# Patient Record
Sex: Male | Born: 1985 | Race: Black or African American | Hispanic: No | Marital: Single | Smoking: Current every day smoker
Health system: Southern US, Community
[De-identification: ages and names within clinical notes are randomized; demographics above are authoritative.]

## PROBLEM LIST (undated history)

## (undated) DIAGNOSIS — I10 Essential (primary) hypertension: Secondary | ICD-10-CM

## (undated) HISTORY — PX: TONSILLECTOMY: SUR1361

## (undated) HISTORY — PX: ADENOIDECTOMY: SUR15

## (undated) HISTORY — PX: NECK SURGERY: SHX720

---

## 2003-09-16 ENCOUNTER — Emergency Department (HOSPITAL_COMMUNITY): Admission: EM | Admit: 2003-09-16 | Discharge: 2003-09-16 | Payer: Self-pay

## 2006-11-20 ENCOUNTER — Emergency Department (HOSPITAL_COMMUNITY): Admission: EM | Admit: 2006-11-20 | Discharge: 2006-11-20 | Payer: Self-pay | Admitting: Emergency Medicine

## 2008-07-19 ENCOUNTER — Emergency Department (HOSPITAL_BASED_OUTPATIENT_CLINIC_OR_DEPARTMENT_OTHER): Admission: EM | Admit: 2008-07-19 | Discharge: 2008-07-19 | Payer: Self-pay | Admitting: Emergency Medicine

## 2008-07-19 ENCOUNTER — Ambulatory Visit: Payer: Self-pay | Admitting: Diagnostic Radiology

## 2011-04-01 ENCOUNTER — Emergency Department (HOSPITAL_BASED_OUTPATIENT_CLINIC_OR_DEPARTMENT_OTHER)
Admission: EM | Admit: 2011-04-01 | Discharge: 2011-04-01 | Disposition: A | Discharge status: Home / Self Care | Payer: Self-pay | Attending: Emergency Medicine | Admitting: Emergency Medicine

## 2011-04-01 ENCOUNTER — Emergency Department (INDEPENDENT_AMBULATORY_CARE_PROVIDER_SITE_OTHER): Payer: Self-pay

## 2011-04-01 DIAGNOSIS — M79609 Pain in unspecified limb: Secondary | ICD-10-CM

## 2011-04-01 DIAGNOSIS — I10 Essential (primary) hypertension: Secondary | ICD-10-CM | POA: Insufficient documentation

## 2011-04-01 DIAGNOSIS — M7989 Other specified soft tissue disorders: Secondary | ICD-10-CM | POA: Insufficient documentation

## 2011-04-01 DIAGNOSIS — F17200 Nicotine dependence, unspecified, uncomplicated: Secondary | ICD-10-CM | POA: Insufficient documentation

## 2011-04-01 DIAGNOSIS — IMO0002 Reserved for concepts with insufficient information to code with codable children: Secondary | ICD-10-CM | POA: Insufficient documentation

## 2011-04-01 HISTORY — DX: Essential (primary) hypertension: I10

## 2011-04-01 MED ORDER — IBUPROFEN 800 MG PO TABS
800.0000 mg | ORAL_TABLET | Freq: Once | ORAL | Status: AC
  Administered 2011-04-01: 800 mg via ORAL

## 2011-04-01 MED ORDER — IBUPROFEN 800 MG PO TABS
ORAL_TABLET | ORAL | Status: AC
  Filled 2011-04-01: qty 1

## 2011-04-01 MED ORDER — CLINDAMYCIN HCL 150 MG PO CAPS
300.0000 mg | ORAL_CAPSULE | Freq: Once | ORAL | Status: DC
  Filled 2011-04-01: qty 2

## 2011-04-01 MED ORDER — CLINDAMYCIN HCL 150 MG PO CAPS: 300.0000 mg | ORAL_CAPSULE | Freq: Three times a day (TID) | ORAL | Status: AC

## 2011-04-01 NOTE — ED Notes (Signed)
Care plan and use of Medications reviewed follow up for BP Reviewed

## 2011-04-01 NOTE — ED Notes (Signed)
Pain/swelling to right middle finger x 3-4 days-denies injury
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
## 2011-04-01 NOTE — ED Notes (Signed)
Pt reports increased pain and swelling to  R hand and middle finger  With decreased mobility

## 2011-12-25 ENCOUNTER — Encounter (HOSPITAL_BASED_OUTPATIENT_CLINIC_OR_DEPARTMENT_OTHER): Payer: Self-pay | Admitting: Emergency Medicine

## 2011-12-25 ENCOUNTER — Emergency Department (HOSPITAL_BASED_OUTPATIENT_CLINIC_OR_DEPARTMENT_OTHER): Payer: Self-pay

## 2011-12-25 ENCOUNTER — Emergency Department (HOSPITAL_BASED_OUTPATIENT_CLINIC_OR_DEPARTMENT_OTHER)
Admission: EM | Admit: 2011-12-25 | Discharge: 2011-12-25 | Disposition: A | Discharge status: Home / Self Care | Payer: Self-pay | Attending: Emergency Medicine | Admitting: Emergency Medicine

## 2011-12-25 DIAGNOSIS — F1490 Cocaine use, unspecified, uncomplicated: Secondary | ICD-10-CM

## 2011-12-25 DIAGNOSIS — R079 Chest pain, unspecified: Secondary | ICD-10-CM | POA: Insufficient documentation

## 2011-12-25 DIAGNOSIS — F1410 Cocaine abuse, uncomplicated: Secondary | ICD-10-CM | POA: Insufficient documentation

## 2011-12-25 DIAGNOSIS — R51 Headache: Secondary | ICD-10-CM | POA: Insufficient documentation

## 2011-12-25 DIAGNOSIS — I10 Essential (primary) hypertension: Secondary | ICD-10-CM | POA: Insufficient documentation

## 2011-12-25 LAB — CBC
HCT: 43.7 % (ref 39.0–52.0)
Hemoglobin: 14.9 g/dL (ref 13.0–17.0)
MCH: 28.2 pg (ref 26.0–34.0)
MCV: 82.8 fL (ref 78.0–100.0)
Platelets: 236 10*3/uL (ref 150–400)
RBC: 5.28 MIL/uL (ref 4.22–5.81)
WBC: 4.9 10*3/uL (ref 4.0–10.5)

## 2011-12-25 LAB — BASIC METABOLIC PANEL
BUN: 13 mg/dL (ref 6–23)
CO2: 27 mEq/L (ref 19–32)
Calcium: 9.8 mg/dL (ref 8.4–10.5)
Chloride: 101 mEq/L (ref 96–112)
Creatinine, Ser: 1.4 mg/dL — ABNORMAL HIGH (ref 0.50–1.35)
Glucose, Bld: 96 mg/dL (ref 70–99)

## 2011-12-25 LAB — TROPONIN I
Troponin I: <0.3 ng/mL (ref <0.30)
Troponin I: <0.3 ng/mL (ref <0.30)

## 2011-12-25 LAB — RAPID URINE DRUG SCREEN, HOSP PERFORMED
Amphetamines: NOT DETECTED
Barbiturates: NOT DETECTED
Benzodiazepines: NOT DETECTED
Tetrahydrocannabinol: POSITIVE — AB

## 2011-12-25 MED ORDER — ASPIRIN 81 MG PO CHEW
324.0000 mg | CHEWABLE_TABLET | Freq: Once | ORAL | Status: AC
  Administered 2011-12-25: 324 mg via ORAL

## 2011-12-25 MED ORDER — DIPHENHYDRAMINE HCL 50 MG/ML IJ SOLN
12.5000 mg | Freq: Once | INTRAMUSCULAR | Status: AC
  Administered 2011-12-25: 12.5 mg via INTRAVENOUS
  Filled 2011-12-25: qty 1

## 2011-12-25 MED ORDER — ASPIRIN 325 MG PO TABS: 325.0000 mg | ORAL_TABLET | ORAL | Status: DC

## 2011-12-25 MED ORDER — METOCLOPRAMIDE HCL 5 MG/ML IJ SOLN
10.0000 mg | Freq: Once | INTRAMUSCULAR | Status: AC
  Administered 2011-12-25: 10 mg via INTRAVENOUS
  Filled 2011-12-25: qty 2

## 2011-12-25 MED ORDER — ACETAMINOPHEN 500 MG PO TABS
ORAL_TABLET | ORAL | Status: AC
  Filled 2011-12-25: qty 2

## 2011-12-25 MED ORDER — NITROGLYCERIN 0.4 MG SL SUBL
0.4000 mg | SUBLINGUAL_TABLET | SUBLINGUAL | Status: DC | PRN
  Filled 2011-12-25: qty 25

## 2011-12-25 MED ORDER — ASPIRIN 81 MG PO CHEW
CHEWABLE_TABLET | ORAL | Status: AC
  Filled 2011-12-25: qty 4

## 2011-12-25 NOTE — ED Notes (Signed)
Pt c/o headache and intermittent chest pain for two weeks.  Pt states headache this am.  Pt not taking medication for HTN, states he lost insurance..  Pt admits to some dizziness at times.

## 2011-12-25 NOTE — ED Provider Notes (Addendum)
History   This chart was scribed for Rodney Numbers, MD scribed by Magnus Sinning. The patient was seen in room MH06/[facility]06 at 17:33.   CSN: [id number]  Arrival date & time 12/25/11  1702   Chief Complaint  Patient presents with  . Chest Pain  . Headache    (Consider location/radiation/quality/duration/timing/severity/associated sxs/prior treatment) HPI Rodney Ramirez is a 26 y.o. male who presents to the Emergency Department complaining of moderate intermittent CP, onset 4 weeks ago with associated HA, onset this morning. Patient states he has had hx of HTN since he was 37 and explains it has been several years since stopping medication for treatment of HTN, which he states is due to loss of insurance and ability to see a physician.  Patient says there are no aggravating or modifying factors of chest pain and that pain can appear at any moment. He currently rates both HA and CP a 6/10 and says this is the worst of his CP. He adds that he has not taken any medication for either complaint. He denies nausea,numbness, tingling, weakness, hx of blood clots in lungs or legs, or IV drug use. However, patient does note that he does use cocaine, last used two days ago, consume alcohol, and smoke. He explains he used to drinking heavily, but has since cut back.   Past Medical History  Diagnosis Date  . Hypertension     Past Surgical History  Procedure Date  . Adenoidectomy   . Tonsillectomy   . Neck surgery     No family history on file.  History  Substance Use Topics  . Smoking status: Current Everyday Smoker  . Smokeless tobacco: Not on file  . Alcohol Use: Yes      Review of Systems  Cardiovascular: Positive for chest pain.  Gastrointestinal: Negative for nausea.  Neurological: Positive for headaches. Negative for weakness and numbness.  All other systems reviewed and are negative.    Allergies  Review of patient's allergies indicates no known allergies.  Home Medications  No  current outpatient prescriptions on file.  There were no vitals taken for this visit.  Physical Exam  Nursing note and vitals reviewed.  GEN: Well-developed, well-nourished male HEENT: Atraumatic, normocephalic. Oropharynx clear without erythema EYES: PERRLA BL, no scleral icterus. NECK: Trachea midline, no meningismus CV: regular rate and rhythm. No murmurs, rubs, or gallops PULM: No respiratory distress.  No crackles, wheezes, or rales. GI: soft, non-tender. No guarding, rebound, or tenderness. + bowel sounds  GU: deferred Neuro: cranial nerves grossly 2-12 intact, no abnormalities of strength or sensation, A and O x 3. No dysmetria. No pronator drift. MSK: Patient moves all 4 extremities symmetrically, no deformity, edema, or injury noted Skin: No rashes petechiae, purpura, or jaundice Psych: no abnormality of mood  ED Course  Procedures (including critical care time)   Date: 12/25/2011  Rate: 89  Rhythm: normal sinus rhythm  QRS Axis: normal  Intervals: normal  ST/T Wave abnormalities: nonspecific T wave changes  Conduction Disutrbances:incomplete RBBB  Narrative Interpretation:   Old EKG Reviewed: none available   DIAGNOSTIC STUDIES: Oxygen Saturation is 100% on 2 L- [place], normal by my interpretation.    COORDINATION OF CARE: 17:38: Physical exam performed. 17:39: Provide intent to or head CT, labs, 500 mg tylenol, 81 mg ASA, and 0.4 mg nitro tablet. Patient agreeable at this time.  18:25: Rechecked performed. BP at 158/89. Pt notes HA is still severe. Cat scan and head CT are negative, thus nitiro cancelled .  Orders for ASA, reglan and benadryl placed.   Labs Reviewed  BASIC METABOLIC PANEL - Abnormal; Notable for the following:    Creatinine, Ser 1.40 (*)     GFR calc non Af Amer 67 (*)     GFR calc Af Amer 78 (*)     All other components within normal limits  CBC  TROPONIN I  URINE RAPID DRUG SCREEN (HOSP PERFORMED)   Dg Chest 2 View  12/25/2011  *RADIOLOGY  REPORT*  Clinical Data: Chest pain, hypertension, and headache  CHEST - 2 VIEW  Comparison: None.  Findings: Heart, mediastinal, and hilar contours are normal.  The pulmonary vascularity is normal.  The lungs are clear.  No pleural effusion.  Trachea midline.  Bony thorax intact.  IMPRESSION: No acute cardiopulmonary disease.   Original Report Authenticated By: Britta Mccreedy, M.D.    Ct Head Wo Contrast  12/25/2011  *RADIOLOGY REPORT*  Clinical Data: 26 year old male with headache and dizziness times 2 weeks.  Hypertension.  CT HEAD WITHOUT CONTRAST  Technique:  Contiguous axial images were obtained from the base of the skull through the vertex without contrast.  Comparison: Face CT 07/19/2008.  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 Stable mild ethmoid sinus mucosal thickening.  Other Visualized paranasal sinuses and mastoids are clear.  Congenital incomplete C1 fusion. No acute osseous abnormality identified.  Visualized orbits and scalp soft tissues are within normal limits.  Cerebral volume is within normal limits for age.  No midline shift, ventriculomegaly, mass effect, evidence of mass lesion, intracranial hemorrhage or evidence of cortically based acute infarction.  Gray-white matter differentiation is within normal limits throughout the brain.  No suspicious intracranial vascular hyperdensity.  IMPRESSION: Normal noncontrast CT appearance of the brain.   Original Report Authenticated By: Ulla Potash III, M.D.      1. Chest pain   2. Cocaine use   3. Hypertension   4. Headache       MDM  Patient was evaluated by myself. Patient was initially quite hypertensive. Complaining of headache as well as chest pain. Patient had resolution of his chest pain and hypertension spontaneously. Repeat values were 150s over 90s. This is consistent with prior visits. Patient did admit to cocaine usage. With this headache and elevated blood pressure CT head was performed and was negative.  Laboratory workup was remarkable only for creatinine of 1.4. Patient did not have associated hyperkalemia. Patient was cautioned about the damage his blood pressure already was doing his kidneys in the importance of followup with her regular Dr. Patient was also advised not to use cocaine and warned about the dangers of this. Patient was chest pain-free without intervention. Headache was treated with Benadryl and Reglan and resolved. 3 hour troponin was ordered and was negative. Patient was discharged in good condition.  I personally performed the services described in this documentation, which was scribed in my presence. The recorded information has been reviewed and considered.           Rodney Numbers, MD 12/25/11 2025  Rodney Numbers, MD 12/25/11 2130  Rodney Numbers, MD 12/25/11 2047

## 2012-10-09 ENCOUNTER — Encounter (HOSPITAL_COMMUNITY): Payer: Self-pay | Admitting: *Deleted

## 2012-10-09 ENCOUNTER — Emergency Department (HOSPITAL_COMMUNITY)
Admission: EM | Admit: 2012-10-09 | Discharge: 2012-10-10 | Disposition: A | Discharge status: Home / Self Care | Payer: Self-pay | Attending: Emergency Medicine | Admitting: Emergency Medicine

## 2012-10-09 DIAGNOSIS — K029 Dental caries, unspecified: Secondary | ICD-10-CM | POA: Insufficient documentation

## 2012-10-09 DIAGNOSIS — I10 Essential (primary) hypertension: Secondary | ICD-10-CM | POA: Insufficient documentation

## 2012-10-09 DIAGNOSIS — F17200 Nicotine dependence, unspecified, uncomplicated: Secondary | ICD-10-CM | POA: Insufficient documentation

## 2012-10-09 DIAGNOSIS — K089 Disorder of teeth and supporting structures, unspecified: Secondary | ICD-10-CM | POA: Insufficient documentation

## 2012-10-09 DIAGNOSIS — K0889 Other specified disorders of teeth and supporting structures: Secondary | ICD-10-CM

## 2012-10-09 NOTE — ED Notes (Signed)
Pt states that he has had dental pain for a long time to bilateral sides of mouth to the bottom; pt states that the pain has gotten worse over the last 7 days

## 2012-10-10 NOTE — ED Provider Notes (Signed)
Medical screening examination/treatment/procedure(s) were performed by non-physician practitioner and as supervising physician I was immediately available for consultation/collaboration.  Olivia Mackie, MD 10/10/12 [id number]

## 2012-10-10 NOTE — ED Provider Notes (Signed)
   History    CSN: [id number] Arrival date & time 10/09/12  2150  First MD Initiated Contact with Patient 10/09/12 2326     Chief Complaint  Patient presents with  . Dental Pain   (Consider location/radiation/quality/duration/timing/severity/associated sxs/prior Treatment) HPI Comments: 27 y/o male presents to the ED complaining of lower dental pain "for a while" worsening over the last 7 days. Pain described as achy, non-radiating, worse with chewing L>R rated 7/10, slightly relieved by tylenol and goody powder. Does not have a dentist. Denies fever, chills, difficulty swallowing.  Patient is a 27 y.o. male presenting with tooth pain. The history is provided by the patient.  Dental Pain Associated symptoms: no fever    Past Medical History  Diagnosis Date  . Hypertension    Past Surgical History  Procedure Laterality Date  . Adenoidectomy    . Tonsillectomy    . Neck surgery     No family history on file. History  Substance Use Topics  . Smoking status: Current Every Day Smoker -- 0.50 packs/day    Types: Cigarettes  . Smokeless tobacco: Not on file  . Alcohol Use: Yes     Comment: socially    Review of Systems  Constitutional: Negative for fever and chills.  HENT: Positive for dental problem. Negative for trouble swallowing.   All other systems reviewed and are negative.    Allergies  Review of patient's allergies indicates no known allergies.  Home Medications   Current Outpatient Rx  Name  Route  Sig  Dispense  Refill  . acetaminophen (TYLENOL) 500 MG tablet   Oral   Take 500 mg by mouth every 6 (six) hours as needed for pain (pain).         . Aspirin-Salicylamide-Caffeine (BC HEADACHE POWDER PO)   Oral   Take 1 packet by mouth every 6 (six) hours as needed (pain).          BP 139/92  Pulse 60  Temp(Src) 98.6 F (37 C) (Oral)  Resp 13  SpO2 100% Physical Exam  Nursing note and vitals reviewed. Constitutional: He is oriented to person, place,  and time. He appears well-developed and well-nourished. No distress.  HENT:  Head: Normocephalic and atraumatic. No trismus in the jaw.  Mouth/Throat: Uvula is midline, oropharynx is clear and moist and mucous membranes are normal. Abnormal dentition. Dental caries present.    Eyes: Conjunctivae are normal.  Neck: Normal range of motion. Neck supple.  Cardiovascular: Normal rate, regular rhythm and normal heart sounds.   Pulmonary/Chest: Effort normal and breath sounds normal.  Musculoskeletal: Normal range of motion. He exhibits no edema.  Lymphadenopathy:       Head (right side): No submental and no submandibular adenopathy present.       Head (left side): No submental and no submandibular adenopathy present.  Neurological: He is alert and oriented to person, place, and time.  Skin: Skin is warm and dry. He is not diaphoretic. No erythema.  Psychiatric: He has a normal mood and affect. His behavior is normal.    ED Course  Procedures (including critical care time) Labs Reviewed - No data to display No results found. 1. Pain, dental     MDM  Dental pain- no evidence of infection or abscess. Chipped tooth present. Mild tenderness. Afebrile, NAD. Discharge with instructions to take ibuprofen, dental f/u. Return precautions discussed. Patient states understanding of plan and is agreeable.   Trevor Mace, PA-C 10/10/12 0015

## 2013-02-27 ENCOUNTER — Emergency Department (HOSPITAL_COMMUNITY): Payer: Self-pay

## 2013-02-27 ENCOUNTER — Emergency Department (HOSPITAL_COMMUNITY)
Admission: EM | Admit: 2013-02-27 | Discharge: 2013-02-27 | Disposition: A | Discharge status: Home / Self Care | Payer: Self-pay | Attending: Emergency Medicine | Admitting: Emergency Medicine

## 2013-02-27 ENCOUNTER — Encounter (HOSPITAL_COMMUNITY): Payer: Self-pay | Admitting: Emergency Medicine

## 2013-02-27 DIAGNOSIS — F17200 Nicotine dependence, unspecified, uncomplicated: Secondary | ICD-10-CM | POA: Insufficient documentation

## 2013-02-27 DIAGNOSIS — S0232XA Fracture of orbital floor, left side, initial encounter for closed fracture: Secondary | ICD-10-CM

## 2013-02-27 DIAGNOSIS — I10 Essential (primary) hypertension: Secondary | ICD-10-CM | POA: Insufficient documentation

## 2013-02-27 DIAGNOSIS — H538 Other visual disturbances: Secondary | ICD-10-CM | POA: Insufficient documentation

## 2013-02-27 DIAGNOSIS — Z9889 Other specified postprocedural states: Secondary | ICD-10-CM | POA: Insufficient documentation

## 2013-02-27 DIAGNOSIS — S0230XA Fracture of orbital floor, unspecified side, initial encounter for closed fracture: Secondary | ICD-10-CM | POA: Insufficient documentation

## 2013-02-27 MED ORDER — HYDROCODONE-ACETAMINOPHEN 5-325 MG PO TABS
2.0000 | ORAL_TABLET | Freq: Once | ORAL | Status: AC
  Administered 2013-02-27: 2 via ORAL
  Filled 2013-02-27: qty 2
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
## 2013-03-05 ENCOUNTER — Encounter (HOSPITAL_COMMUNITY): Payer: Self-pay | Admitting: Pharmacy Technician

## 2013-03-06 ENCOUNTER — Encounter (HOSPITAL_COMMUNITY): Payer: Self-pay | Admitting: *Deleted

## 2013-03-07 ENCOUNTER — Encounter (HOSPITAL_COMMUNITY): Payer: Self-pay | Admitting: *Deleted

## 2013-03-07 ENCOUNTER — Ambulatory Visit (HOSPITAL_COMMUNITY)
Admission: RE | Admit: 2013-03-07 | Discharge: 2013-03-08 | Disposition: A | Discharge status: Home / Self Care | Payer: Self-pay | Source: Ambulatory Visit | Attending: Oral and Maxillofacial Surgery | Admitting: Oral and Maxillofacial Surgery

## 2013-03-07 ENCOUNTER — Encounter (HOSPITAL_COMMUNITY)
Admission: RE | Disposition: A | Discharge status: Home / Self Care | Payer: Self-pay | Source: Ambulatory Visit | Attending: Oral and Maxillofacial Surgery

## 2013-03-07 ENCOUNTER — Encounter (HOSPITAL_COMMUNITY): Payer: Self-pay | Admitting: Anesthesiology

## 2013-03-07 ENCOUNTER — Ambulatory Visit (HOSPITAL_COMMUNITY): Payer: Self-pay | Admitting: Anesthesiology

## 2013-03-07 DIAGNOSIS — S0232XD Fracture of orbital floor, left side, subsequent encounter for fracture with routine healing: Secondary | ICD-10-CM

## 2013-03-07 DIAGNOSIS — F17200 Nicotine dependence, unspecified, uncomplicated: Secondary | ICD-10-CM | POA: Insufficient documentation

## 2013-03-07 DIAGNOSIS — S0230XA Fracture of orbital floor, unspecified side, initial encounter for closed fracture: Secondary | ICD-10-CM | POA: Insufficient documentation

## 2013-03-07 DIAGNOSIS — I10 Essential (primary) hypertension: Secondary | ICD-10-CM | POA: Insufficient documentation

## 2013-03-07 DIAGNOSIS — S0232XA Fracture of orbital floor, left side, initial encounter for closed fracture: Secondary | ICD-10-CM

## 2013-03-07 HISTORY — PX: ORIF ORBITAL FRACTURE: SHX5312

## 2013-03-07 LAB — CBC
Hemoglobin: 15.7 g/dL (ref 13.0–17.0)
MCH: 29.7 pg (ref 26.0–34.0)
MCHC: 33.9 g/dL (ref 30.0–36.0)
RBC: 5.28 MIL/uL (ref 4.22–5.81)
RDW: 14.5 % (ref 11.5–15.5)

## 2013-03-07 LAB — BASIC METABOLIC PANEL
BUN: 9 mg/dL (ref 6–23)
CO2: 28 mEq/L (ref 19–32)
Creatinine, Ser: 1.23 mg/dL (ref 0.50–1.35)
GFR calc Af Amer: >90 mL/min (ref >90)
GFR calc non Af Amer: 78 mL/min — ABNORMAL LOW (ref >90)
Glucose, Bld: 89 mg/dL (ref 70–99)
Sodium: 141 mEq/L (ref 135–145)

## 2013-03-07 SURGERY — OPEN REDUCTION INTERNAL FIXATION (ORIF) ORBITAL FRACTURE
Anesthesia: General | Site: Eye | Laterality: Left | Wound class: Clean

## 2013-03-07 MED ORDER — ARTIFICIAL TEARS OP OINT
TOPICAL_OINTMENT | OPHTHALMIC | Status: DC | PRN
  Administered 2013-03-07: 1 via OPHTHALMIC

## 2013-03-07 MED ORDER — NEOSTIGMINE METHYLSULFATE 1 MG/ML IJ SOLN
INTRAMUSCULAR | Status: DC | PRN
  Administered 2013-03-07: 5 mg via INTRAVENOUS

## 2013-03-07 MED ORDER — ROCURONIUM BROMIDE 100 MG/10ML IV SOLN
INTRAVENOUS | Status: DC | PRN
  Administered 2013-03-07: 50 mg via INTRAVENOUS

## 2013-03-07 MED ORDER — BACITRACIN-POLYMYXIN B 500-10000 UNIT/GM OP OINT
TOPICAL_OINTMENT | OPHTHALMIC | Status: DC | PRN
  Administered 2013-03-07: 1 via OPHTHALMIC

## 2013-03-07 MED ORDER — HYDROMORPHONE HCL PF 1 MG/ML IJ SOLN: 0.2500 mg | INTRAMUSCULAR | Status: DC | PRN

## 2013-03-07 MED ORDER — SUCCINYLCHOLINE CHLORIDE 20 MG/ML IJ SOLN
INTRAMUSCULAR | Status: DC | PRN
  Administered 2013-03-07: 140 mg via INTRAVENOUS

## 2013-03-07 MED ORDER — BSS IO SOLN
INTRAOCULAR | Status: DC | PRN
  Administered 2013-03-07: 15 mL via INTRAOCULAR

## 2013-03-07 MED ORDER — MEPERIDINE HCL 25 MG/ML IJ SOLN: 6.2500 mg | INTRAMUSCULAR | Status: DC | PRN

## 2013-03-07 MED ORDER — GLYCOPYRROLATE 0.2 MG/ML IJ SOLN
INTRAMUSCULAR | Status: DC | PRN
  Administered 2013-03-07: .7 mg via INTRAVENOUS

## 2013-03-07 MED ORDER — LIDOCAINE-EPINEPHRINE 2 %-1:100000 IJ SOLN
INTRAMUSCULAR | Status: AC
  Filled 2013-03-07: qty 1

## 2013-03-07 MED ORDER — CEFAZOLIN SODIUM-DEXTROSE 2-3 GM-% IV SOLR
2.0000 g | Freq: Once | INTRAVENOUS | Status: DC
  Filled 2013-03-07: qty 50

## 2013-03-07 MED ORDER — OXYCODONE HCL 5 MG PO TABS: 5.0000 mg | ORAL_TABLET | Freq: Once | ORAL | Status: AC | PRN

## 2013-03-07 MED ORDER — PROPOFOL 10 MG/ML IV BOLUS
INTRAVENOUS | Status: DC | PRN
  Administered 2013-03-07: 100 mg via INTRAVENOUS
  Administered 2013-03-07: 350 mg via INTRAVENOUS
  Administered 2013-03-07: 50 mg via INTRAVENOUS

## 2013-03-07 MED ORDER — LIDOCAINE HCL (CARDIAC) 20 MG/ML IV SOLN
INTRAVENOUS | Status: DC | PRN
  Administered 2013-03-07: 50 mg via INTRAVENOUS

## 2013-03-07 MED ORDER — LACTATED RINGERS IV SOLN: INTRAVENOUS | Status: DC

## 2013-03-07 MED ORDER — MIDAZOLAM HCL 2 MG/2ML IJ SOLN: 0.5000 mg | Freq: Once | INTRAMUSCULAR | Status: AC | PRN

## 2013-03-07 MED ORDER — BACITRACIN-POLYMYXIN B 500-10000 UNIT/GM OP OINT
TOPICAL_OINTMENT | OPHTHALMIC | Status: AC
  Filled 2013-03-07: qty 3.5

## 2013-03-07 MED ORDER — DEXTROSE 5 % IV SOLN
INTRAVENOUS | Status: DC | PRN
  Administered 2013-03-07: 19:00:00 via INTRAVENOUS

## 2013-03-07 MED ORDER — LACTATED RINGERS IV SOLN
INTRAVENOUS | Status: DC | PRN
  Administered 2013-03-07 (×2): via INTRAVENOUS

## 2013-03-07 MED ORDER — ONDANSETRON HCL 4 MG/2ML IJ SOLN
INTRAMUSCULAR | Status: DC | PRN
  Administered 2013-03-07: 4 mg via INTRAVENOUS

## 2013-03-07 MED ORDER — POLYMYXIN B SULFATE 500000 UNITS IJ SOLR
INTRAMUSCULAR | Status: DC | PRN
  Administered 2013-03-07: 20:00:00

## 2013-03-07 MED ORDER — FENTANYL CITRATE 0.05 MG/ML IJ SOLN
INTRAMUSCULAR | Status: DC | PRN
  Administered 2013-03-07: 100 ug via INTRAVENOUS
  Administered 2013-03-07 (×3): 50 ug via INTRAVENOUS
  Administered 2013-03-07: 100 ug via INTRAVENOUS
  Administered 2013-03-07: 50 ug via INTRAVENOUS
  Administered 2013-03-07 (×3): 100 ug via INTRAVENOUS
  Administered 2013-03-07: 50 ug via INTRAVENOUS

## 2013-03-07 MED ORDER — 0.9 % SODIUM CHLORIDE (POUR BTL) OPTIME
TOPICAL | Status: DC | PRN
  Administered 2013-03-07: 1000 mL

## 2013-03-07 MED ORDER — BSS IO SOLN
INTRAOCULAR | Status: AC
  Filled 2013-03-07: qty 15

## 2013-03-07 MED ORDER — DEXTROSE 5 % IV SOLN
3.0000 g | INTRAVENOUS | Status: DC | PRN
  Administered 2013-03-07: 3 g via INTRAVENOUS

## 2013-03-07 MED ORDER — CEFAZOLIN SODIUM 1-5 GM-% IV SOLN
INTRAVENOUS | Status: AC
  Filled 2013-03-07: qty 50

## 2013-03-07 MED ORDER — LIDOCAINE-EPINEPHRINE 2 %-1:100000 IJ SOLN
INTRAMUSCULAR | Status: DC | PRN
  Administered 2013-03-07: 20 mL via INTRADERMAL

## 2013-03-07 MED ORDER — OXYCODONE HCL 5 MG/5ML PO SOLN
5.0000 mg | Freq: Once | ORAL | Status: AC | PRN
  Administered 2013-03-07: 5 mg via ORAL

## 2013-03-07 MED ORDER — MIDAZOLAM HCL 5 MG/5ML IJ SOLN
INTRAMUSCULAR | Status: DC | PRN
  Administered 2013-03-07: 2 mg via INTRAVENOUS

## 2013-03-07 MED ORDER — PROMETHAZINE HCL 25 MG/ML IJ SOLN: 6.2500 mg | INTRAMUSCULAR | Status: DC | PRN

## 2013-03-07 MED ORDER — DEXAMETHASONE SODIUM PHOSPHATE 4 MG/ML IJ SOLN
INTRAMUSCULAR | Status: DC | PRN
  Administered 2013-03-07: 8 mg via INTRAVENOUS

## 2013-03-07 MED ORDER — OXYCODONE HCL 5 MG/5ML PO SOLN
ORAL | Status: AC
  Filled 2013-03-07: qty 5

## 2013-03-07 SURGICAL SUPPLY — 55 items
BIT DRILL UPPR FCE 0.9M 4 TWST (BIT) IMPLANT
CANISTER SUCTION 2500CC (MISCELLANEOUS) ×2 IMPLANT
CLEANER TIP ELECTROSURG 2X2 (MISCELLANEOUS) ×2 IMPLANT
CLOTH BEACON ORANGE TIMEOUT ST (SAFETY) ×2 IMPLANT
COVER SURGICAL LIGHT HANDLE (MISCELLANEOUS) ×2 IMPLANT
CRADLE DONUT ADULT HEAD (MISCELLANEOUS) IMPLANT
DECANTER SPIKE VIAL GLASS SM (MISCELLANEOUS) ×2 IMPLANT
DRILL UPPERFACE 0.9M 4MM TWIST (BIT) ×2
ELECT COATED BLADE 2.86 ST (ELECTRODE) ×2 IMPLANT
ELECT NDL BLADE 2-5/6 (NEEDLE) ×1 IMPLANT
ELECT NEEDLE BLADE 2-5/6 (NEEDLE) ×2 IMPLANT
ELECT REM PT RETURN 9FT ADLT (ELECTROSURGICAL) ×2
ELECTRODE REM PT RTRN 9FT ADLT (ELECTROSURGICAL) ×1 IMPLANT
GLOVE BIOGEL PI IND STRL 7.5 (GLOVE) ×1 IMPLANT
GLOVE BIOGEL PI IND STRL 8 (GLOVE) IMPLANT
GLOVE BIOGEL PI INDICATOR 7.5 (GLOVE) ×1
GLOVE BIOGEL PI INDICATOR 8 (GLOVE) ×1
GLOVE ECLIPSE 7.5 STRL STRAW (GLOVE) ×2 IMPLANT
GLOVE SURG SS PI 8.0 STRL IVOR (GLOVE) ×2 IMPLANT
GOWN STRL NON-REIN LRG LVL3 (GOWN DISPOSABLE) ×4 IMPLANT
GOWN STRL REIN 2XL LVL4 (GOWN DISPOSABLE) ×1 IMPLANT
IMPL MEDPOR MTB LEFT 41X42 (Mesh General) IMPLANT
IMPLANT MEDPOR MTB LEFT 41X42 (Mesh General) ×2 IMPLANT
KIT BASIN OR (CUSTOM PROCEDURE TRAY) ×2 IMPLANT
KIT ROOM TURNOVER OR (KITS) ×2 IMPLANT
NDL HYPO 25X1 1.5 SAFETY (NEEDLE) ×1 IMPLANT
NEEDLE HYPO 25X1 1.5 SAFETY (NEEDLE) ×2 IMPLANT
NS IRRIG 1000ML POUR BTL (IV SOLUTION) ×2 IMPLANT
PAD ARMBOARD 7.5X6 YLW CONV (MISCELLANEOUS) ×4 IMPLANT
PATTIES SURGICAL .5 X3 (DISPOSABLE) IMPLANT
PENCIL FOOT CONTROL (ELECTRODE) ×2 IMPLANT
PROTECTOR CORNEAL (OPHTHALMIC RELATED) ×1 IMPLANT
SCREW UPPERFACE 1.2X4M SLF TAP (Screw) ×3 IMPLANT
SPONGE INTESTINAL PEANUT (DISPOSABLE) ×2 IMPLANT
SUT CHROMIC 3 0 PS 2 (SUTURE) ×2 IMPLANT
SUT CHROMIC 4 0 PS 5 (SUTURE) ×2 IMPLANT
SUT CHROMIC 5 0 P 3 (SUTURE) ×1 IMPLANT
SUT ETHILON 5 0 P 3 18 (SUTURE) ×1
SUT ETHILON 6 0 P 1 (SUTURE) ×2 IMPLANT
SUT MON AB 5-0 P3 18 (SUTURE) ×2 IMPLANT
SUT NOVAFIL 6 0 PRE 2 4412 13 (SUTURE) ×2 IMPLANT
SUT NYLON ETHILON 5-0 P-3 1X18 (SUTURE) ×1 IMPLANT
SUT PLAIN 5 0 P 3 18 (SUTURE) ×1 IMPLANT
SUT PROLENE 3 0 PS 2 (SUTURE) ×1 IMPLANT
SUT SILK 2 0 FS (SUTURE) ×2 IMPLANT
SUT STEEL 0 (SUTURE)
SUT STEEL 0 18XMFL TIE 17 (SUTURE) IMPLANT
SUT STEEL 2 (SUTURE) IMPLANT
SUT VIC AB 5-0 P-3 18XBRD (SUTURE) ×1 IMPLANT
SUT VIC AB 5-0 P3 18 (SUTURE) ×2
SUT VIC AB 5-0 TF 27 (SUTURE) ×1 IMPLANT
TOWEL OR 17X24 6PK STRL BLUE (TOWEL DISPOSABLE) ×2 IMPLANT
TOWEL OR 17X26 10 PK STRL BLUE (TOWEL DISPOSABLE) ×2 IMPLANT
TRAY ENT MC OR (CUSTOM PROCEDURE TRAY) ×2 IMPLANT
WATER STERILE IRR 1000ML POUR (IV SOLUTION) ×2 IMPLANT

## 2013-03-07 NOTE — Anesthesia Postprocedure Evaluation (Signed)
  Anesthesia Post-op Note  Patient: Rodney Ramirez  Procedure(s) Performed: Procedure(s): ORIF LEFT ORBITAL FLOOR (Left)  Patient Location: PACU  Anesthesia Type:General  Level of Consciousness: awake, alert , oriented and patient cooperative  Airway and Oxygen Therapy: Patient Spontanous Breathing and Patient connected to nasal cannula oxygen  Post-op Pain: mild  Post-op Assessment: Post-op Vital signs reviewed, Patient's Cardiovascular Status Stable, Respiratory Function Stable, Patent Airway, No signs of Nausea or vomiting and Pain level controlled  Post-op Vital Signs: Reviewed and stable  Complications: No apparent anesthesia complications

## 2013-03-07 NOTE — Anesthesia Procedure Notes (Signed)
Procedure Name: Intubation Date/Time: 03/07/2013 7:07 PM Performed by: Coralee Rud Pre-anesthesia Checklist: Patient identified, Timeout performed, Emergency Drugs available, Suction available and Patient being monitored Patient Re-evaluated:Patient Re-evaluated prior to inductionOxygen Delivery Method: Circle system utilized Preoxygenation: Pre-oxygenation with 100% oxygen Intubation Type: IV induction and Cricoid Pressure applied Ventilation: Mask ventilation without difficulty Laryngoscope Size: Mac and 4 Grade View: Grade II Tube type: Oral Tube size: 8.0 mm Number of attempts: 1 Airway Equipment and Method: Stylet Placement Confirmation: ETT inserted through vocal cords under direct vision,  breath sounds checked- equal and bilateral and positive ETCO2 Secured at: 24 cm Tube secured with: Tape Dental Injury: Teeth and Oropharynx as per pre-operative assessment

## 2013-03-07 NOTE — Preoperative (Signed)
Beta Blockers   Reason not to administer Beta Blockers:Not Applicable 

## 2013-03-07 NOTE — Brief Op Note (Signed)
03/07/2013  10:11 PM  PATIENT:  Rodney Ramirez  26 y.o. male  PRE-OPERATIVE DIAGNOSIS:  LEFT ORBITAL FLOOR BLOW OUT FRACTURE  POST-OPERATIVE DIAGNOSIS:  LEFT ORBITAL FLOOR BLOW OUT FRACTURE  PROCEDURE:  Procedure(s): ORIF LEFT ORBITAL FLOOR (Left)  SURGEON:  Surgeon(s) and Role:    * [name], DDS - Primary  PHYSICIAN ASSISTANT: none   ASSISTANTS: none   ANESTHESIA:   general  EBL:  minimal  BLOOD ADMINISTERED:none  DRAINS: none   LOCAL MEDICATIONS USED:  2% LIDOCAINE with 1:100,000 epinephrine  SPECIMEN:  No Specimen  DISPOSITION OF SPECIMEN:  N/A  COUNTS:  YES  TOURNIQUET:  * No tourniquets in log *  DICTATION: .Note written in EPIC  PLAN OF CARE: Admit to inpatient   PATIENT DISPOSITION:  PACU - hemodynamically stable.   Delay start of Pharmacological VTE agent (>24hrs) due to surgical blood loss or risk of bleeding: not applicable

## 2013-03-07 NOTE — Transfer of Care (Signed)
Immediate Anesthesia Transfer of Care Note  Patient: Rodney Ramirez  Procedure(s) Performed: Procedure(s): ORIF LEFT ORBITAL FLOOR (Left)  Patient Location: PACU  Anesthesia Type:General  Level of Consciousness: awake and patient cooperative  Airway & Oxygen Therapy: Patient Spontanous Breathing and Patient connected to nasal cannula oxygen  Post-op Assessment: Report given to PACU RN and Post -op Vital signs reviewed and stable  Post vital signs: Reviewed and stable  Complications: No apparent anesthesia complications

## 2013-03-07 NOTE — Progress Notes (Signed)
Pt with snoring respirations and intermittent apnea. Pt. Is arousable. Pt. States his eye hurts when awakened.  Emotional support provided.

## 2013-03-07 NOTE — Interval H&P Note (Signed)
History and Physical Interval Note:  03/07/2013 5:35 PM  Rodney Ramirez  has presented today for surgery, with the diagnosis of COMMUTED LEFT ORBITAL FLOOR BLOW OUT FRACTURE  The various methods of treatment have been discussed with the patient and family. After consideration of risks, benefits and other options for treatment, the patient has consented to  Procedure(s): ORIF LEFT ORBITAL FLOOR (Left) as a surgical intervention .  The patient's history has been reviewed, patient examined, no change in status, stable for surgery.  I have reviewed the patient's chart and labs.  Questions were answered to the patient's satisfaction.     Bardwell,Delores Thelen L
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
## 2013-03-07 NOTE — Brief Op Note (Signed)
03/07/2013  6:11 PM  PATIENT:  Rodney Ramirez  27 y.o. male  PRE-OPERATIVE DIAGNOSIS:  LEFT ORBITAL FLOOR BLOW OUT FRACTURE  POST-OPERATIVE DIAGNOSIS:  LEFT ORBITAL FLOOR BLOW OUT FRACTURE  PROCEDURE:  Procedure(s): ORIF LEFT ORBITAL FLOOR (Left)  SURGEON:  Surgeon(s) and Role:    * [name], DDS - Primary  PHYSICIAN ASSISTANT: none   ASSISTANTS: none   ANESTHESIA:   general  EBL:  minimal  BLOOD ADMINISTERED:none  DRAINS: none   LOCAL MEDICATIONS USED:  2% LIDOCAINE with 1:100,000 epinephrine  SPECIMEN:  No Specimen  DISPOSITION OF SPECIMEN:  N/A  COUNTS:  YES  TOURNIQUET:  * No tourniquets in log *  DICTATION: .Note written in EPIC  PLAN OF CARE: Admit to inpatient   PATIENT DISPOSITION:  PACU - hemodynamically stable.   Delay start of Pharmacological VTE agent (>24hrs) due to surgical blood loss or risk of bleeding: not applicable
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
## 2013-03-07 NOTE — Anesthesia Preprocedure Evaluation (Addendum)
Anesthesia Evaluation  Patient identified by MRN, date of birth, ID band Patient awake  General Assessment Comment:NPO since 2200 03/06/13  Reviewed: Allergy & Precautions, H&P , NPO status , Patient's Chart, lab work & pertinent test results  History of Anesthesia Complications Negative for: history of anesthetic complications  Airway Mallampati: II TM Distance: >3 FB Neck ROM: Full    Dental  (+) Teeth Intact and Dental Advisory Given   Pulmonary Current Smoker,  .5 ppd breath sounds clear to auscultation  Pulmonary exam normal       Cardiovascular hypertension (patient stopped taking his meds), Rhythm:Regular Rate:Normal  No medical treatment   Neuro/Psych negative neurological ROS  negative psych ROS   GI/Hepatic negative GI ROS, Neg liver ROS,   Endo/Other  negative endocrine ROS  Renal/GU negative Renal ROS     Musculoskeletal negative musculoskeletal ROS (+)   Abdominal (+) + obese,   Peds  Hematology negative hematology ROS (+)   Anesthesia Other Findings   Reproductive/Obstetrics negative OB ROS                        Anesthesia Physical Anesthesia Plan  ASA: II  Anesthesia Plan: General   Post-op Pain Management:    Induction: Intravenous  Airway Management Planned: Oral ETT  Additional Equipment:   Intra-op Plan:   Post-operative Plan: Extubation in OR  Informed Consent:   Dental advisory given  Plan Discussed with: CRNA and Surgeon  Anesthesia Plan Comments: (Plan routine monitors, GETA)       Anesthesia Quick Evaluation

## 2013-03-08 NOTE — Progress Notes (Signed)
Discussed pain management with Dr. Jean Rosenthal.  Pt resting quietly, snoring at times.  Pts has no s/s of pain other than pts statement that his eye hurts when I wake him. I gave patient one dose of oral pain medicine for pain control.  Discharge instructions reviewed with the patients mother and questions answered.  Pt has prescription at home for pain medicine already and mother has the pills with her.

## 2013-03-09 ENCOUNTER — Encounter (HOSPITAL_COMMUNITY): Payer: Self-pay | Admitting: Oral and Maxillofacial Surgery

## 2013-11-14 ENCOUNTER — Emergency Department (HOSPITAL_COMMUNITY)
Admission: EM | Admit: 2013-11-14 | Discharge: 2013-11-15 | Disposition: A | Discharge status: Home / Self Care | Payer: No Typology Code available for payment source | Attending: Emergency Medicine | Admitting: Emergency Medicine

## 2013-11-14 DIAGNOSIS — L03119 Cellulitis of unspecified part of limb: Principal | ICD-10-CM

## 2013-11-14 DIAGNOSIS — L03116 Cellulitis of left lower limb: Secondary | ICD-10-CM

## 2013-11-14 DIAGNOSIS — L678 Other hair color and hair shaft abnormalities: Secondary | ICD-10-CM | POA: Insufficient documentation

## 2013-11-14 DIAGNOSIS — I10 Essential (primary) hypertension: Secondary | ICD-10-CM | POA: Insufficient documentation

## 2013-11-14 DIAGNOSIS — L738 Other specified follicular disorders: Secondary | ICD-10-CM | POA: Insufficient documentation

## 2013-11-14 DIAGNOSIS — L02419 Cutaneous abscess of limb, unspecified: Secondary | ICD-10-CM | POA: Insufficient documentation

## 2013-11-14 DIAGNOSIS — L739 Follicular disorder, unspecified: Secondary | ICD-10-CM

## 2013-11-14 DIAGNOSIS — F17200 Nicotine dependence, unspecified, uncomplicated: Secondary | ICD-10-CM | POA: Insufficient documentation

## 2013-11-14 DIAGNOSIS — R21 Rash and other nonspecific skin eruption: Secondary | ICD-10-CM | POA: Insufficient documentation

## 2013-11-15 ENCOUNTER — Encounter (HOSPITAL_COMMUNITY): Payer: Self-pay | Admitting: Emergency Medicine

## 2013-11-15 MED ORDER — CLINDAMYCIN HCL 150 MG PO CAPS: 300.0000 mg | ORAL_CAPSULE | Freq: Three times a day (TID) | ORAL | Status: DC

## 2013-11-15 NOTE — Discharge Instructions (Signed)

## 2013-11-15 NOTE — ED Notes (Signed)
Pt c/o red swollen areas to LLE and bilat thighs onset earlier this week urticaria noted.

## 2013-11-15 NOTE — ED Provider Notes (Signed)
CSN: [id number]     Arrival date & time 11/14/13  2357 History   First MD Initiated Contact with Patient 11/15/13 0016     Chief Complaint  Patient presents with  . Rash    (Consider location/radiation/quality/duration/timing/severity/associated sxs/prior Treatment) Patient is a 28 y.o. male presenting with rash. The history is provided by the patient. No language interpreter was used.  Rash Location:  Leg Leg rash location:  L lower leg Quality: painful, redness and weeping   Quality: not blistering, not bruising, not burning, not dry, not itchy, not peeling and not scaling   Pain details:    Quality:  Aching   Severity:  Mild   Onset quality:  Gradual   Duration:  4 days   Timing:  Constant   Progression:  Worsening Severity:  Mild Onset quality:  Gradual Duration:  4 days Timing:  Constant Progression:  Worsening Chronicity:  New Context: not chemical exposure, not exposure to similar rash, not food, not insect bite/sting, not medications, not new detergent/soap, not plant contact and not sick contacts   Relieved by:  Nothing Worsened by:  Nothing tried Ineffective treatments:  None tried Associated symptoms: no fever, no hoarse voice, no induration, no shortness of breath, no throat swelling, no tongue swelling, not vomiting and not wheezing     Past Medical History  Diagnosis Date  . Hypertension    Past Surgical History  Procedure Laterality Date  . Adenoidectomy    . Tonsillectomy    . Neck surgery    . Orif orbital fracture Left 03/07/2013    Procedure: ORIF LEFT ORBITAL FLOOR;  Surgeon: Isac Caddy, DDS;  Location: [place];  Service: Oral Surgery;  Laterality: Left;   Family History  Problem Relation Age of Onset  . Alzheimer's disease Other   . Cancer Other    History  Substance Use Topics  . Smoking status: Current Every Day Smoker -- 0.25 packs/day    Types: Cigarettes  . Smokeless tobacco: Never Used  . Alcohol Use: Yes     Comment:  socially    Review of Systems  Constitutional: Negative for fever.  HENT: Negative for hoarse voice.   Respiratory: Negative for shortness of breath and wheezing.   Gastrointestinal: Negative for vomiting.  Skin: Positive for rash.  All other systems reviewed and are negative.    Allergies  Review of patient's allergies indicates no known allergies.  Home Medications   Prior to Admission medications   Medication Sig Start Date End Date Taking? Authorizing Provider  clindamycin (CLEOCIN) 150 MG capsule Take 2 capsules (300 mg total) by mouth 3 (three) times daily. May dispense as 150mg  capsules 11/15/13   Antonietta Breach, PA-C   BP 140/97  Pulse 113  Temp(Src) 98.6 F (37 C) (Oral)  Resp 16  Ht 6\' 2"  (1.88 m)  Wt 240 lb (108.863 kg)  BMI 30.80 kg/m2  SpO2 100%  Physical Exam  Nursing note and vitals reviewed. Constitutional: He is oriented to person, place, and time. He appears well-developed and well-nourished. No distress.  Nontoxic/nonseptic appearing  HENT:  Head: Normocephalic and atraumatic.  Mouth/Throat: Oropharynx is clear and moist. No oropharyngeal exudate.  Oropharynx clear. Patient tolerating secretions without difficulty. No angioedema.  Eyes: Conjunctivae and EOM are normal. No scleral icterus.  Neck: Normal range of motion.  Cardiovascular: Normal rate, regular rhythm and normal heart sounds.   Pulmonary/Chest: Effort normal. No respiratory distress. He has no wheezes.  Chest expansion symmetric  Musculoskeletal: Normal  range of motion.  Neurological: He is alert and oriented to person, place, and time. He exhibits normal muscle tone. Coordination normal.  No gross sensory deficits. Patient ambulates with normal gait. He moves extremities without ataxia.  Skin: Skin is warm and dry. Rash noted. He is not diaphoretic. No erythema. No pallor.  Scattered punctate papules and pustules appreciated to the anterior left lower extremity and L thigh. Papules and  pustules associated with hair follicles. Areas are mildly erythematous. No surrounding soft tissue induration. No purulent drainage. No red linear streaking. No skin peeling.  Psychiatric: He has a normal mood and affect. His behavior is normal.    ED Course  Procedures (including critical care time) Labs Review Labs Reviewed - No data to display  Imaging Review No results found.   EKG Interpretation None      MDM   Final diagnoses:  Folliculitis  Cellulitis of left lower leg    28 year old male presents to the emergency department for LLE rash. Physical exam today consistent with folliculitis. No concern for SJS, erythema multiforme major or minor. Patient neurovascularly intact. He ambulates with a steady gait. Patient nontoxic and nonseptic appearing, afebrile, and hemodynamically stable. He will be started on course of Clindamycin for symptoms. Patient stable for discharge with necessary return precautions. Patient agreeable to plan with no unaddressed concerns.    Antonietta Breach, PA-C 11/16/13 2317

## 2013-11-17 NOTE — ED Provider Notes (Signed)
Medical screening examination/treatment/procedure(s) were performed by non-physician practitioner and as supervising physician I was immediately available for consultation/collaboration.   EKG Interpretation None       Raya Mckinstry M Yvanna Vidas, MD 11/17/13 0734 

## 2014-02-20 ENCOUNTER — Emergency Department (HOSPITAL_COMMUNITY)
Admission: EM | Admit: 2014-02-20 | Discharge: 2014-02-20 | Disposition: A | Discharge status: Home / Self Care | Payer: No Typology Code available for payment source | Attending: Emergency Medicine | Admitting: Emergency Medicine

## 2014-02-20 ENCOUNTER — Emergency Department (HOSPITAL_COMMUNITY): Payer: No Typology Code available for payment source

## 2014-02-20 DIAGNOSIS — Z720 Tobacco use: Secondary | ICD-10-CM | POA: Insufficient documentation

## 2014-02-20 DIAGNOSIS — Z792 Long term (current) use of antibiotics: Secondary | ICD-10-CM | POA: Insufficient documentation

## 2014-02-20 DIAGNOSIS — R112 Nausea with vomiting, unspecified: Secondary | ICD-10-CM | POA: Insufficient documentation

## 2014-02-20 DIAGNOSIS — R0789 Other chest pain: Secondary | ICD-10-CM | POA: Insufficient documentation

## 2014-02-20 DIAGNOSIS — I10 Essential (primary) hypertension: Secondary | ICD-10-CM | POA: Insufficient documentation

## 2014-02-20 LAB — CBC
HCT: 47 % (ref 39.0–52.0)
Hemoglobin: 15.3 g/dL (ref 13.0–17.0)
MCH: 28.3 pg (ref 26.0–34.0)
MCHC: 32.6 g/dL (ref 30.0–36.0)
MCV: 87 fL (ref 78.0–100.0)
PLATELETS: 270 10*3/uL (ref 150–400)
RBC: 5.4 MIL/uL (ref 4.22–5.81)
RDW: 14.8 % (ref 11.5–15.5)
WBC: 6.1 10*3/uL (ref 4.0–10.5)

## 2014-02-20 LAB — BASIC METABOLIC PANEL
Anion gap: 13 (ref 5–15)
BUN: 10 mg/dL (ref 6–23)
CALCIUM: 9.9 mg/dL (ref 8.4–10.5)
CO2: 27 meq/L (ref 19–32)
CREATININE: 1.43 mg/dL — AB (ref 0.50–1.35)
Chloride: 101 mEq/L (ref 96–112)
GFR calc non Af Amer: 65 mL/min — ABNORMAL LOW (ref >90)
GFR, EST AFRICAN AMERICAN: 75 mL/min — AB (ref >90)
Glucose, Bld: 93 mg/dL (ref 70–99)
Potassium: 4.1 mEq/L (ref 3.7–5.3)
SODIUM: 141 meq/L (ref 137–147)

## 2014-02-20 LAB — I-STAT TROPONIN, ED: Troponin i, poc: 0 ng/mL (ref 0.00–0.08)

## 2014-02-20 MED ORDER — SODIUM CHLORIDE 0.9 % IV BOLUS (SEPSIS)
1000.0000 mL | Freq: Once | INTRAVENOUS | Status: AC
  Administered 2014-02-20: 1000 mL via INTRAVENOUS

## 2014-02-20 MED ORDER — PANTOPRAZOLE SODIUM 40 MG PO TBEC: 40.0000 mg | DELAYED_RELEASE_TABLET | Freq: Every day | ORAL | Status: AC

## 2014-02-20 MED ORDER — PANTOPRAZOLE SODIUM 40 MG IV SOLR
40.0000 mg | Freq: Once | INTRAVENOUS | Status: AC
  Administered 2014-02-20: 40 mg via INTRAVENOUS
  Filled 2014-02-20: qty 40

## 2014-02-20 MED ORDER — ONDANSETRON HCL 4 MG/2ML IJ SOLN
4.0000 mg | Freq: Once | INTRAMUSCULAR | Status: AC
  Administered 2014-02-20: 4 mg via INTRAVENOUS
  Filled 2014-02-20: qty 2

## 2014-02-20 NOTE — ED Provider Notes (Signed)
CSN: [id number]     Arrival date & time 02/20/14  1619 History   First MD Initiated Contact with Patient 02/20/14 1805     Chief Complaint  Patient presents with  . Chest Pain  . Emesis     (Consider location/radiation/quality/duration/timing/severity/associated sxs/prior Treatment) HPI Mr. Rodney Ramirez a 28 year old male with past medical history of hypertension who presents to the ER withchest pain, nausea, vomiting. Patient states his nausea and vomiting began around 10 AM today, and reports 5-6 episodes of vomiting throughout the day. Patient reports an intermittent chest pain for the past several months which occurs daily.patient reports his chest pain is a sharp pain in the central chest which radiates into his left chest. Patient states he does not typically become nauseated with his chest pain.patient reports his pain recurs randomly throughout the day, and the episodes of chest pain last anywhere from 10-30 minutes. He denies having any aggravating or alleviating factors. Patient denies having associated shortness of breath, palpitations, weakness, dizziness, blurred vision, abdominal pain, dysuria.  Past Medical History  Diagnosis Date  . Hypertension    Past Surgical History  Procedure Laterality Date  . Adenoidectomy    . Tonsillectomy    . Neck surgery    . Orif orbital fracture Left 03/07/2013    Procedure: ORIF LEFT ORBITAL FLOOR;  Surgeon: Francene Findershristopher L Valatie, DDS;  [facility];  Service: Oral Surgery;  Laterality: Left;   Family History  Problem Relation Age of Onset  . Alzheimer's disease Other   . Cancer Other    History  Substance Use Topics  . Smoking status: Current Every Day Smoker -- 0.25 packs/day    Types: Cigarettes  . Smokeless tobacco: Never Used  . Alcohol Use: Yes     Comment: socially    Review of Systems  Constitutional: Negative for fever.  HENT: Negative for trouble swallowing.   Eyes: Negative for visual disturbance.  Respiratory:  Negative for shortness of breath.   Cardiovascular: Negative for chest pain.  Gastrointestinal: Negative for nausea, vomiting and abdominal pain.  Genitourinary: Negative for dysuria.  Musculoskeletal: Negative for neck pain.  Skin: Negative for rash.  Neurological: Negative for dizziness, weakness and numbness.  Psychiatric/Behavioral: Negative.       Allergies  Review of patient's allergies indicates no known allergies.  Home Medications   Prior to Admission medications   Medication Sig Start Date End Date Taking? Authorizing Provider  amphetamine-dextroamphetamine (ADDERALL) 20 MG tablet Take 20 mg by mouth daily as needed (low energy).   Yes Historical Provider, MD  clindamycin (CLEOCIN) 150 MG capsule Take 2 capsules (300 mg total) by mouth 3 (three) times daily. May dispense as 150mg  capsules 11/15/13   Antony MaduraKelly Humes, PA-C  pantoprazole (PROTONIX) 40 MG tablet Take 1 tablet (40 mg total) by mouth daily. 02/20/14   Monte FantasiaJoseph W Mylan Schwarz, PA-C   BP 168/94 mmHg  Pulse 64  Temp(Src) 98.2 F (36.8 C) (Oral)  Resp 16  SpO2 100% Physical Exam  Constitutional: He is oriented to person, place, and time. He appears well-developed and well-nourished. No distress.  HENT:  Head: Normocephalic and atraumatic.  Mouth/Throat: Oropharynx is clear and moist. No oropharyngeal exudate.  Eyes: Right eye exhibits no discharge. Left eye exhibits no discharge. No scleral icterus.  Neck: Normal range of motion.  Cardiovascular: Normal rate, regular rhythm and normal heart sounds.   No murmur heard. Pulmonary/Chest: Effort normal and breath sounds normal. No accessory muscle usage. No tachypnea. No respiratory distress.  Abdominal:  Soft. Normal appearance and bowel sounds are normal. There is no tenderness.  Musculoskeletal: Normal range of motion. He exhibits no edema or tenderness.  Neurological: He is alert and oriented to person, place, and time. No cranial nerve deficit. Coordination normal.  Skin:  Skin is warm and dry. No rash noted. He is not diaphoretic.  Psychiatric: He has a normal mood and affect.    ED Course  Procedures (including critical care time) Labs Review Labs Reviewed  BASIC METABOLIC PANEL - Abnormal; Notable for the following:    Creatinine, Ser 1.43 (*)    GFR calc non Af Amer 65 (*)    GFR calc Af Amer 75 (*)    All other components within normal limits  CBC  I-STAT TROPOININ, ED    Imaging Review Dg Chest 2 View  02/20/2014   CLINICAL DATA:  Chest pain and discomfort for 2 months. Nausea this morning.  EXAM: CHEST  2 VIEW  COMPARISON:  12/25/2011  FINDINGS: The heart size and mediastinal contours are within normal limits. Both lungs are clear. The visualized skeletal structures are unremarkable.  IMPRESSION: No active cardiopulmonary disease.   Electronically Signed   By: Rosalie GumsBeth  Brown M.D.   On: 02/20/2014 19:27     EKG Interpretation   Date/Time:  Wednesday February 20 2014 16:31:57 EST Ventricular Rate:  93 PR Interval:  127 QRS Duration: 93 QT Interval:  353 QTC Calculation: 439 R Axis:   -9 Text Interpretation:  Sinus rhythm Probable anteroseptal infarct, old No  significant change since last tracing Confirmed by [facility]  MD, MARTHA  [phone]([id number]) on 02/20/2014 8:03:41 PM      MDM   Final diagnoses:  Chest discomfort  Nausea and vomiting, vomiting of unspecified type    Patient complaining of chest discomfort times "several months" on a daily basis along with nausea and vomiting just today.patient's pain is not exertional, has no specific alleviating or aggravating factors. It does not sound cardiac in nature, however we will perform cardiac workup for rule out of ACS. HEART score 1, PERC negative.  Symptomatic therapy.  8:00 PM: Patient asymptomatic at this time.patient has not experienced any of the chest discomfort while in the emergency room today.  Patient is to be discharged with recommendation to follow up with PCP in regards to today's  hospital visit. Chest pain is not likely of cardiac or pulmonary etiology d/t presentation, perc negative, VSS, no tracheal deviation, no JVD or new murmur, RRR, breath sounds equal bilaterally, EKG without acute abnormalities, negative troponin, and negative CXR. Pt has been advised to return to the ED is CP becomes exertional, associated with diaphoresis or nausea, radiates to left jaw/arm, worsens or becomes concerning in any way. Pt appears reliable for follow up and is agreeable to discharge.   BP 168/94 mmHg  Pulse 64  Temp(Src) 98.2 F (36.8 C) (Oral)  Resp 16  SpO2 100%  Signed,  Ladona MowJoe Doryce Mcgregory, PA-C 2:24 AM        Monte FantasiaJoseph W Paislee Szatkowski, PA-C 02/21/14 [phone]  Ethelda ChickMartha K Linker, MD 02/21/14 [phone]

## 2014-02-20 NOTE — ED Notes (Signed)
Pt states that he has had chest pain and n/v x 1 day. Also had an episode several months ago similar. Alert and oriented.

## 2014-02-20 NOTE — Discharge Instructions (Signed)
Follow-up with primary care doctor. Refer to resource guide below. Return to the ER if you develop any severe chest pain, shortness of breath, nausea, vomiting, dizziness, weakness. Use Protonix once a day until you can follow-up with a primary care doctor.  Chest Pain (Nonspecific) It is often hard to give a specific diagnosis for the cause of chest pain. There is always a chance that your pain could be related to something serious, such as a heart attack or a blood clot in the lungs. You need to follow up with your health care provider for further evaluation. CAUSES   Heartburn.  Pneumonia or bronchitis.  Anxiety or stress.  Inflammation around your heart (pericarditis) or lung (pleuritis or pleurisy).  A blood clot in the lung.  A collapsed lung (pneumothorax). It can develop suddenly on its own (spontaneous pneumothorax) or from trauma to the chest.  Shingles infection (herpes zoster virus). The chest wall is composed of bones, muscles, and cartilage. Any of these can be the source of the pain.  The bones can be bruised by injury.  The muscles or cartilage can be strained by coughing or overwork.  The cartilage can be affected by inflammation and become sore (costochondritis). DIAGNOSIS  Lab tests or other studies may be needed to find the cause of your pain. Your health care provider may have you take a test called an ambulatory electrocardiogram (ECG). An ECG records your heartbeat patterns over a 24-hour period. You may also have other tests, such as:  Transthoracic echocardiogram (TTE). During echocardiography, sound waves are used to evaluate how blood flows through your heart.  Transesophageal echocardiogram (TEE).  Cardiac monitoring. This allows your health care provider to monitor your heart rate and rhythm in real time.  Holter monitor. This is a portable device that records your heartbeat and can help diagnose heart arrhythmias. It allows your health care provider to  track your heart activity for several days, if needed.  Stress tests by exercise or by giving medicine that makes the heart beat faster. TREATMENT   Treatment depends on what may be causing your chest pain. Treatment may include:  Acid blockers for heartburn.  Anti-inflammatory medicine.  Pain medicine for inflammatory conditions.  Antibiotics if an infection is present.  You may be advised to change lifestyle habits. This includes stopping smoking and avoiding alcohol, caffeine, and chocolate.  You may be advised to keep your head raised (elevated) when sleeping. This reduces the chance of acid going backward from your stomach into your esophagus. Most of the time, nonspecific chest pain will improve within 2-3 days with rest and mild pain medicine.  HOME CARE INSTRUCTIONS   If antibiotics were prescribed, take them as directed. Finish them even if you start to feel better.  For the next few days, avoid physical activities that bring on chest pain. Continue physical activities as directed.  Do not use any tobacco products, including cigarettes, chewing tobacco, or electronic cigarettes.  Avoid drinking alcohol.  Only take medicine as directed by your health care provider.  Follow your health care provider's suggestions for further testing if your chest pain does not go away.  Keep any follow-up appointments you made. If you do not go to an appointment, you could develop lasting (chronic) problems with pain. If there is any problem keeping an appointment, call to reschedule. SEEK MEDICAL CARE IF:   Your chest pain does not go away, even after treatment.  You have a rash with blisters on your chest.  You have a fever. SEEK IMMEDIATE MEDICAL CARE IF:   You have increased chest pain or pain that spreads to your arm, neck, jaw, back, or abdomen.  You have shortness of breath.  You have an increasing cough, or you cough up blood.  You have severe back or abdominal  pain.  You feel nauseous or vomit.  You have severe weakness.  You faint.  You have chills. This is an emergency. Do not wait to see if the pain will go away. Get medical help at once. Call your local emergency services (911 in U.S.). Do not drive yourself to the hospital. MAKE SURE YOU:   Understand these instructions.  Will watch your condition.  Will get help right away if you are not doing well or get worse. Document Released: 01/13/2005 Document Revised: 04/10/2013 Document Reviewed: 11/09/2007 [facility] Patient Information 2015 [place], [place]. This information is not intended to replace advice given to you by your health care provider. Make sure you discuss any questions you have with your health care provider.   Emergency Department Resource Guide 1) Find a Doctor and Pay Out of Pocket Although you won't have to find out who is covered by your insurance plan, it is a good idea to ask around and get recommendations. You will then need to call the office and see if the doctor you have chosen will accept you as a new patient and what types of options they offer for patients who are self-pay. Some doctors offer discounts or will set up payment plans for their patients who do not have insurance, but you will need to ask so you aren't surprised when you get to your appointment.  2) Contact Your Local Health Department Not all health departments have doctors that can see patients for sick visits, but many do, so it is worth a call to see if yours does. If you don't know where your local health department is, you can check in your phone book. The CDC also has a tool to help you locate your state's health department, and many state websites also have listings of all of their local health departments.  3) Find a Walk-in Clinic If your illness is not likely to be very severe or complicated, you may want to try a walk in clinic. These are popping up all over the country in pharmacies,  drugstores, and shopping centers. They're usually staffed by nurse practitioners or physician assistants that have been trained to treat common illnesses and complaints. They're usually fairly quick and inexpensive. However, if you have serious medical issues or chronic medical problems, these are probably not your best option.  No Primary Care Doctor: - Call Health Connect at  [phone] - they can help you locate a primary care doctor that  accepts your insurance, provides certain services, etc. - Physician Referral Service- [phone]  Chronic Pain Problems: Organization         Address  Phone   Notes  [facility]  [phone] Patients need to be referred by their primary care doctor.   Medication Assistance: Organization         Address  Phone   Notes  [facility] Medication [facility] [address] [place], [place] [address] [phone] --Must be a resident of [facility] -- Must have NO insurance coverage whatsoever (no Medicaid/ Medicare, etc.) -- The pt. MUST have a primary care doctor that directs their care regularly and follows them in the community  MedAssist  [phone]   [employer]  [phone]    Agencies that provide inexpensive medical care: Organization         Address  Phone   Notes  [facility]  [phone]   [facility]    [phone]   [facility] [address] [place], [place] [address] [phone]   [facility] [place] [address], [place] [phone]   Planned Parenthood    [phone]   [facility]    [phone]   [facility]  [address], [place] Phone:  [phone]) [phone], Fax:  [phone]) [phone] Hours of Operation:  9 am - 6 pm, M-F.  Also accepts Medicaid/Medicare and self-pay.  [facility]  [address], [place] Phone:  [phone], Fax: [phone]. Hours of Operation:  8:30 am - 5:30 pm, M-F.  Also accepts Medicaid and self-pay.  [facility] [place] [address], [place] Phone: [phone]) [phone]   [facility] [address], [place], [place] [phone], Ext. [phone] Mondays & Thursdays: 7-9 AM.  First 15 patients are seen on a first come, first serve basis.    Medicaid-accepting [facility] Providers:  Organization         Address  Phone   Notes  [facility] [address], [place] [phone] Also accepts self-pay patients.  [facility] [address] [place], [place]  [phone]   [facility] [address], [place] [phone]   [facility] [address] [place] [address], [place] [phone]   Renaye RakersVeita Bland [address], [place]   [phone] Only accepts [place] Access [place] patients after they have their name applied to their card.   Self-Pay (no insurance) in [facility]:  Organization         Address  Phone   Notes  Sickle Cell Patients, [facility] [address], [place] [phone]   [facility] [address], [place] [phone]   [facility]  [address], [place] [phone]   [facility]/Dr. Osei-Bonsu  [address] [place] [address], [place] or [phone], [place] [phone] Phone number for both [place] and [place] locations is the same.  [facility] and [facility] [address], [place] [phone]   [facility] [place] [address] [place] [address], [place] or [address] [phone]   [facility] [address], [place] [phone], phone; [phone], fax Sees patients 1st and 3rd Saturday of every month.  Must not qualify for public  or private insurance (i.e. Medicaid, Medicare, [place] Health Choice, Veterans' Benefits)  Household income should be no more than 200% of the poverty level The clinic cannot treat you if you are pregnant or think you are pregnant  Sexually transmitted diseases are not treated at the clinic.    Dental Care: Organization         Address  Phone  Notes  [facility] Department of [facility] [address], [place] [phone] Accepts children up to age 30 who are enrolled in [place] or [place] Health Choice;  pregnant women with a Medicaid card; and children who have applied for Medicaid or [place] Health Choice, but were declined, whose parents can pay a reduced fee at time of service.  [facility] of [facility]  [address], [place] [phone] Accepts children up to age 50 who are enrolled in [place] or [place] Health Choice; pregnant women with a Medicaid card; and children who have applied for Medicaid or [place] Health Choice, but were declined, whose parents can pay a reduced fee at time of service.  [facility]  [address] [place], [place] [phone] Patients are seen by appointment only. Walk-ins are not accepted. [facility] will see patients 40 years of age and older. Monday - Tuesday (8am-5pm) Most Wednesdays (8:30-5pm) $30 per visit, cash only  [facility] Adult Dental Access PROGRAM  [address], [facility] [phone] Patients are seen by appointment only. Walk-ins are not accepted. [facility] will see patients 74 years of age and older. One Wednesday Evening (Monthly: Volunteer Based).  $30 per visit, cash only  [employer]  [phone] for adults; Children under age 35, call Graduate Pediatric Dentistry at [phone]. Children aged 48-14, please call [phone] to request a pediatric application.  Dental services are provided in all areas of  dental care including fillings, crowns and bridges, complete and partial dentures, implants, gum treatment, root canals, and extractions. Preventive care is also provided. Treatment is provided to both adults and children. Patients are selected via a lottery and there is often a waiting list.   [facility] [address] [place]  [phone] [email or web address]   [facility] [address] [place], [place] [phone], Ext. [phone] Second and Fourth Thursday of each month, opens at 6:30 AM; Clinic ends at 9 AM.  Patients are seen on a first-come first-served basis, and a limited number are seen during each clinic.   [facility]  [address] [place], [place] [phone]   Eligibility Requirements You must have lived in [place], [place], or [place] counties for at least the last three months.   You cannot be eligible for state or federal sponsored [place], including [employer], [place], or [employer].   You generally cannot be eligible for healthcare insurance through your employer.    How to apply: Eligibility screenings are held every Tuesday and Wednesday afternoon from 1:00 pm until 4:00 pm. You do not need an appointment for the interview!  [facility] [address], [place], [place] [phone]   [facility] Health Department  [phone]   [facility] Health Department  [phone]   [facility] Health Department  [phone]    Behavioral Health Resources in the Community: Intensive Outpatient Programs Organization         Address  Phone  Notes  [facility] [address], [place], [place] [phone]   [facility] Outpatient [address], [place], [place] [phone]   [facility]: [facility] [address] [place], [place]  [phone]   [facility] [address],  [place], [place] [phone] or  [phone]   Substance Abuse Resources Organization         Address  Phone  Notes  [facility] Drug Services  [phone]   [facility]  [phone]   [facility] [place]  [phone]  Floydene Flock  [phone]   Residential & Outpatient Substance Abuse Program  [phone]   Psychological Services Organization         Address  Phone  Notes  [facility]  [phone]   [facility] Services  [phone]   [facility] [address], [place] [phone] or [phone]    Mobile Crisis Teams Organization         Address  Phone  Notes  [facility], Mobile Crisis Care Unit  [phone]   [facility]  [address] [place], [place] [phone]   Doristine Locks [address] [place] [phone]    Self-Help/Support Groups Organization         Address  Phone             Notes  [facility] [place] - variety of support groups  [phone]- [phone] Call for more information  Narcotics Anonymous (NA), [facility] [address], [employer] [place]  2 meetings at this location   Statistician         Address  Phone  Notes  [facility] [address],    [place]  [phone]   [facility]  [address], [place] [address], [place], [place] [phone]   [facility] [address] [place], [place] [phone] Admissions: 8am-3pm M-F  [facility] [address] [place] [address]    [place], [place] [phone]   [facility] [address] [place], [place] [employer], [place] [phone]   [facility] [address] [place] [address],  [place], [place] [phone]   [facility] - Intensive Outpatient [address], [place], [place] [phone]   [facility] [address]  [place], [place] [phone] or [phone]   Residential  Treatment Services (RTS) [address], [place], [place] [phone] Accepts Medicaid  [facility] [address],  [place] [phone] Substance Abuse/Addiction Treatment   [facility] Organization         Address  Phone  Notes  [facility]  [phone]   Angie Fava, PhD [address] Knack [place], [place]   [phone] or [phone]   [facility]   [address] [place], [place] [phone]   [facility] [address] [place], [place] [phone] Insurance/Medicaid/sponsorship through [facility] and Families [address]                                    [place], [place] [phone] Therapy/tele-psych/case  [facility] [address] [place], [place] [phone]    Dr. Lolly Mustache  [phone]   [facility] [place]  [employer] [address] [place] [facility] Dept. 1) [address], [place] 2) [address], [place] 3)  [address], [place] [phone]  [phone]   [facility] Child Abuse Hotline [phone]) [phone] or [phone] (After Hours)

## 2014-06-13 ENCOUNTER — Encounter (HOSPITAL_BASED_OUTPATIENT_CLINIC_OR_DEPARTMENT_OTHER): Payer: Self-pay | Admitting: *Deleted

## 2014-06-13 ENCOUNTER — Emergency Department (HOSPITAL_BASED_OUTPATIENT_CLINIC_OR_DEPARTMENT_OTHER)
Admission: EM | Admit: 2014-06-13 | Discharge: 2014-06-13 | Disposition: A | Discharge status: Home / Self Care | Payer: 59 | Attending: Emergency Medicine | Admitting: Emergency Medicine

## 2014-06-13 DIAGNOSIS — Z792 Long term (current) use of antibiotics: Secondary | ICD-10-CM | POA: Diagnosis not present

## 2014-06-13 DIAGNOSIS — I10 Essential (primary) hypertension: Secondary | ICD-10-CM | POA: Diagnosis not present

## 2014-06-13 DIAGNOSIS — Z79899 Other long term (current) drug therapy: Secondary | ICD-10-CM | POA: Insufficient documentation

## 2014-06-13 DIAGNOSIS — B070 Plantar wart: Secondary | ICD-10-CM | POA: Insufficient documentation

## 2014-06-13 DIAGNOSIS — M79674 Pain in right toe(s): Secondary | ICD-10-CM | POA: Diagnosis present

## 2014-06-13 DIAGNOSIS — Z720 Tobacco use: Secondary | ICD-10-CM | POA: Diagnosis not present

## 2014-06-13 NOTE — Discharge Instructions (Signed)
Follow up with one of the resources below to establish care with a primary care doctor. Use over the counter plantar wart medication on your toe.  Plantar Warts Warts are benign (noncancerous) growths of the outer skin layer. They can occur at any time in life but are most common during childhood and the teen years. Warts can occur on many skin surfaces of the body. When they occur on the underside (sole) of your foot they are called plantar warts. They often emerge in groups with several small warts encircling a larger growth. CAUSES  Human papillomavirus (HPV) is the cause of plantar warts. HPV attacks a break in the skin of the foot. Walking barefoot can lead to exposure to the wart virus. Plantar warts tend to develop over areas of pressure such as the heel and ball of the foot. Plantar warts often grow into the deeper layers of skin. They may spread to other areas of the sole but cannot spread to other areas of the body. SYMPTOMS  You may also notice a growth on the undersurface of your foot. The wart may grow directly into the sole of the foot, or rise above the surface of the skin on the sole of the foot, or both. They are most often flat from pressure. Warts generally do not cause itching but may cause pain in the area of the wart when you put weight on your foot. DIAGNOSIS  Diagnosis is made by physical examination. This means your caregiver discovers it while examining your foot.  TREATMENT  There are many ways to treat plantar warts. However, warts are very tough. Sometimes it is difficult to treat them so that they go away completely and do not grow back. Any treatment must be done regularly to work. If left untreated, most plantar warts will eventually disappear over a period of one to two years. Treatments you can do at home include:  Putting duct tape over the top of the wart (occlusion) has been found to be effective over several months. The duct tape should be removed each night and  reapplied until the wart has disappeared.  Placing over-the-counter medications on top of the wart to help kill the wart virus and remove the wart tissue (salicylic acid, cantharidin, and dichloroacetic acid) are useful. These are called keratolytic agents. These medications make the skin soft and gradually layers will shed away. These compounds are usually placed on the wart each night and then covered with a bandage. They are also available in premedicated bandage form. Avoid surrounding skin when applying these liquids as these medications can burn healthy skin. The treatment may take several months of nightly use to be effective.  Cryotherapy to freeze the wart has recently become available over-the-counter for children 4 years and older. This system makes use of a soft narrow applicator connected to a bottle of compressed cold liquid that is applied directly to the wart. This medication can burn healthy skin and should be used with caution.  As with all over-the-counter medications, read the directions carefully before use. Treatments generally done in your caregiver's office include:  Some aggressive treatments may cause discomfort, discoloration, and scarring of the surrounding skin. The risks and benefits of treatment should be discussed with your caregiver.  Freezing the wart with liquid nitrogen (cryotherapy, see above).  Burning the wart with use of very high heat (cautery).  Injecting medication into the wart.  Surgically removing or laser treatment of the wart.  Your caregiver may refer you to  a dermatologist for difficult to treat large-sized warts or large numbers of warts. HOME CARE INSTRUCTIONS   Soak the affected area in warm water. Dry the area completely when you are done. Remove the top layer of softened skin, then apply the chosen topical medication and reapply a bandage.  Remove the bandage daily and file excess wart tissue (pumice stone works well for this purpose).  Repeat the entire process daily or every other day for weeks until the plantar wart disappears.  Several brands of salicylic acid pads are available as over-the-counter remedies.  Pain can be relieved by wearing a donut bandage. This is a bandage with a hole in it. The bandage is put on with the hole over the wart. This helps take the pressure off the wart and gives pain relief. To help prevent plantar warts:  Wear shoes and socks and change them daily.  Keep feet clean and dry.  Check your feet and your children's feet regularly.  Avoid direct contact with warts on other people.  Have growths or changes on your skin checked by your caregiver. Document Released: 06/26/2003 Document Revised: 08/20/2013 Document Reviewed: 12/04/2008 [facility] Patient Information 2015 [place], [place]. This information is not intended to replace advice given to you by your health care provider. Make sure you discuss any questions you have with your health care provider.

## 2014-06-13 NOTE — ED Notes (Signed)
Pain in his right 3rd toe for 2 months. No relief with callus medication.

## 2014-06-13 NOTE — ED Provider Notes (Signed)
CSN: [id number]     Arrival date & time 06/13/14  1237 History   First MD Initiated Contact with Patient 06/13/14 1306     Chief Complaint  Patient presents with  . Toe Pain     (Consider location/radiation/quality/duration/timing/severity/associated sxs/prior Treatment) HPI Comments: 29 year old male presenting with right third toe pain 2-3 months. He has tried using callus medication with no relief. No known injury or trauma. Denies any drainage. No fevers. He does not have a PCP.  Patient is a 29 y.o. male presenting with toe pain. The history is provided by the patient.  Toe Pain Pertinent negatives include no numbness.    Past Medical History  Diagnosis Date  . Hypertension    Past Surgical History  Procedure Laterality Date  . Adenoidectomy    . Tonsillectomy    . Neck surgery    . Orif orbital fracture Left 03/07/2013    Procedure: ORIF LEFT ORBITAL FLOOR;  Surgeon: Francene Findershristopher L [place], DDS;  [facility];  Service: Oral Surgery;  Laterality: Left;   Family History  Problem Relation Age of Onset  . Alzheimer's disease Other   . Cancer Other    History  Substance Use Topics  . Smoking status: Current Every Day Smoker -- 0.25 packs/day    Types: Cigarettes  . Smokeless tobacco: Never Used  . Alcohol Use: Yes     Comment: socially    Review of Systems  Constitutional: Negative.   HENT: Negative.   Respiratory: Negative.   Cardiovascular: Negative.   Skin: Positive for wound.  Neurological: Negative for numbness.      Allergies  Review of patient's allergies indicates no known allergies.  Home Medications   Prior to Admission medications   Medication Sig Start Date End Date Taking? Authorizing Provider  amphetamine-dextroamphetamine (ADDERALL) 20 MG tablet Take 20 mg by mouth daily as needed (low energy).    Historical Provider, MD  clindamycin (CLEOCIN) 150 MG capsule Take 2 capsules (300 mg total) by mouth 3 (three) times daily. May dispense as  150mg  capsules 11/15/13   Antony MaduraKelly Humes, PA-C  pantoprazole (PROTONIX) 40 MG tablet Take 1 tablet (40 mg total) by mouth daily. 02/20/14   Monte FantasiaJoseph W Mintz, PA-C   BP 161/97 mmHg  Pulse 95  Temp(Src) 98.1 F (36.7 C) (Oral)  Resp 20  Ht 6\' 3"  (1.905 m)  Wt 275 lb (124.739 kg)  BMI 34.37 kg/m2  SpO2 99% Physical Exam  Constitutional: He is oriented to person, place, and time. He appears well-developed and well-nourished. No distress.  HENT:  Head: Normocephalic and atraumatic.  Eyes: Conjunctivae and EOM are normal.  Neck: Normal range of motion. Neck supple.  Cardiovascular: Normal rate, regular rhythm and normal heart sounds.   Pulmonary/Chest: Effort normal and breath sounds normal.  Musculoskeletal: Normal range of motion. He exhibits no edema.  Neurological: He is alert and oriented to person, place, and time.  Skin: Skin is warm and dry.  3 mm verrucous plaque on plantar aspect of right 3rd toe. No erythema or warmth. Tender.  Psychiatric: He has a normal mood and affect. His behavior is normal.  Nursing note and vitals reviewed.   ED Course  Procedures (including critical care time) Labs Review Labs Reviewed - No data to display  Imaging Review No results found.   EKG Interpretation None      MDM   Final diagnoses:  Plantar wart   NAD. Advised OTC topical plantar wart medication. Resources given to f/u with  PCP. Stable for d/c. Return precautions given. Patient states understanding of treatment care plan and is agreeable.  Kathrynn Speed, PA-C 06/13/14 1316  Gilda Crease, MD 06/13/14 [phone]

## 2014-08-01 ENCOUNTER — Encounter (HOSPITAL_COMMUNITY): Payer: Self-pay | Admitting: *Deleted

## 2014-08-01 ENCOUNTER — Emergency Department (HOSPITAL_COMMUNITY)
Admission: EM | Admit: 2014-08-01 | Discharge: 2014-08-01 | Disposition: A | Discharge status: Home / Self Care | Payer: 59 | Source: Home / Self Care | Attending: Family Medicine | Admitting: Family Medicine

## 2014-08-01 DIAGNOSIS — B356 Tinea cruris: Secondary | ICD-10-CM

## 2014-08-01 DIAGNOSIS — L738 Other specified follicular disorders: Secondary | ICD-10-CM | POA: Diagnosis not present

## 2014-08-01 MED ORDER — TERBINAFINE HCL 1 % EX CREA: 1.0000 "application " | TOPICAL_CREAM | Freq: Two times a day (BID) | CUTANEOUS | Status: DC

## 2014-08-01 MED ORDER — DOXYCYCLINE HYCLATE 100 MG PO CAPS: 100.0000 mg | ORAL_CAPSULE | Freq: Two times a day (BID) | ORAL | Status: DC

## 2014-08-01 MED ORDER — TERBINAFINE HCL 250 MG PO TABS: 250.0000 mg | ORAL_TABLET | Freq: Every day | ORAL | Status: DC

## 2014-08-01 NOTE — ED Provider Notes (Signed)
CSN: [id number]     Arrival date & time 08/01/14  1524 History   First MD Initiated Contact with Patient 08/01/14 1654     Chief Complaint  Patient presents with  . Rash   (Consider location/radiation/quality/duration/timing/severity/associated sxs/prior Treatment) Patient is a 29 y.o. male presenting with rash. The history is provided by the patient.  Rash Location:  Full body Quality: dryness, itchiness, painful and redness   Pain details:    Severity:  Mild   Onset quality:  Gradual   Duration:  2 months   Progression:  Worsening Progression:  Spreading Chronicity:  New Context comment:  Pustular lesions and assoc groin rash. Relieved by:  Anti-itch cream Associated symptoms: no fever     Past Medical History  Diagnosis Date  . Hypertension    Past Surgical History  Procedure Laterality Date  . Adenoidectomy    . Tonsillectomy    . Neck surgery    . Orif orbital fracture Left 03/07/2013    Procedure: ORIF LEFT ORBITAL FLOOR;  Surgeon: Francene Findershristopher L [place], DDS;  [facility];  Service: Oral Surgery;  Laterality: Left;   Family History  Problem Relation Age of Onset  . Alzheimer's disease Other   . Cancer Other    History  Substance Use Topics  . Smoking status: Current Every Day Smoker -- 0.25 packs/day    Types: Cigarettes  . Smokeless tobacco: Never Used  . Alcohol Use: Yes     Comment: socially    Review of Systems  Constitutional: Negative.  Negative for fever.  Skin: Positive for rash.    Allergies  Review of patient's allergies indicates no known allergies.  Home Medications   Prior to Admission medications   Medication Sig Start Date End Date Taking? Authorizing Provider  amphetamine-dextroamphetamine (ADDERALL) 20 MG tablet Take 20 mg by mouth daily as needed (low energy).    Historical Provider, MD  clindamycin (CLEOCIN) 150 MG capsule Take 2 capsules (300 mg total) by mouth 3 (three) times daily. May dispense as 150mg  capsules 11/15/13    Antony MaduraKelly Humes, PA-C  doxycycline (VIBRAMYCIN) 100 MG capsule Take 1 capsule (100 mg total) by mouth 2 (two) times daily. 08/01/14   Linna HoffJames D Camauri Fleece, MD  pantoprazole (PROTONIX) 40 MG tablet Take 1 tablet (40 mg total) by mouth daily. 02/20/14   Ladona MowJoe Mintz, PA-C  terbinafine (LAMISIL) 1 % cream Apply 1 application topically 2 (two) times daily. To groin area 08/01/14   Linna HoffJames D Blade Scheff, MD  terbinafine (LAMISIL) 250 MG tablet Take 1 tablet (250 mg total) by mouth daily. 08/01/14   Linna HoffJames D Favian Kittleson, MD   BP 140/82 mmHg  Pulse 78  Temp(Src) 98.6 F (37 C) (Oral)  Resp 20  SpO2 100% Physical Exam  Constitutional: He is oriented to person, place, and time. He appears well-developed and well-nourished.  Neurological: He is alert and oriented to person, place, and time.  Skin: Skin is warm and dry. Rash noted. There is erythema.  Scattered papulopustular lesions and tinea appearing bilat groin rash.  Nursing note and vitals reviewed.   ED Course  Procedures (including critical care time) Labs Review Labs Reviewed - No data to display  Imaging Review No results found.   MDM   1. Bacterial folliculitis   2. Tinea cruris due to trichophyton rubrum        Linna HoffJames D Romanita Fager, MD 08/03/14 [phone]

## 2014-08-01 NOTE — ED Notes (Signed)
Pt  Has   Swollen  Tender   Bumps  To  l   Arm    And     Bump  r     Leg     As   Well         And r  shoulder    Symptoms  For  Several    Days        Pt  Also  Has   A  Rash  And  Irritated  Area  To  Groin   That  Appears   Different  Than   The   Other bumps

## 2014-08-01 NOTE — Discharge Instructions (Signed)
Take all of medicine, use scrub brush daily,

## 2015-08-27 ENCOUNTER — Encounter (HOSPITAL_COMMUNITY): Payer: Self-pay | Admitting: Emergency Medicine

## 2015-08-27 ENCOUNTER — Emergency Department (HOSPITAL_COMMUNITY)
Admission: EM | Admit: 2015-08-27 | Discharge: 2015-08-27 | Disposition: A | Discharge status: Home / Self Care | Payer: Self-pay | Attending: Emergency Medicine | Admitting: Emergency Medicine

## 2015-08-27 DIAGNOSIS — Z792 Long term (current) use of antibiotics: Secondary | ICD-10-CM | POA: Insufficient documentation

## 2015-08-27 DIAGNOSIS — I10 Essential (primary) hypertension: Secondary | ICD-10-CM | POA: Insufficient documentation

## 2015-08-27 DIAGNOSIS — Z79899 Other long term (current) drug therapy: Secondary | ICD-10-CM | POA: Insufficient documentation

## 2015-08-27 DIAGNOSIS — K0889 Other specified disorders of teeth and supporting structures: Secondary | ICD-10-CM | POA: Insufficient documentation

## 2015-08-27 DIAGNOSIS — F17210 Nicotine dependence, cigarettes, uncomplicated: Secondary | ICD-10-CM | POA: Insufficient documentation

## 2015-08-27 MED ORDER — PENICILLIN V POTASSIUM 500 MG PO TABS: 500.0000 mg | ORAL_TABLET | Freq: Four times a day (QID) | ORAL | Status: DC

## 2015-08-27 MED ORDER — NAPROXEN 500 MG PO TABS: 500.0000 mg | ORAL_TABLET | Freq: Two times a day (BID) | ORAL | Status: DC

## 2015-08-27 NOTE — ED Notes (Signed)
Pt in  Tears co lower leftt toothache started yesterday. Reports taking meds at home without relief.

## 2015-08-27 NOTE — ED Provider Notes (Signed)
CSN: [id number]     Arrival date & time 08/27/15  1503 History  By signing my name below, I, Octavia Heirrianna Nassar, attest that this documentation has been prepared under the direction and in the presence of Sharilyn SitesLisa Marlan Steward, PA-C. Electronically Signed: Octavia HeirArianna Nassar, ED Scribe. 08/27/2015. 3:30 PM.    Chief Complaint  Patient presents with  . Dental Pain      The history is provided by the patient. No language interpreter was used.   HPI Comments: Nile RiggsMichael Sergent is a 30 y.o. male who has a PMHx of HTN presents to the Emergency Department complaining of sudden onset, constant, gradual worsening, moderate, throbbing, left lower dental pain onset yesterday. Pt reports having a broken tooth in the area that has given him pain. He reports taking BC powders, aleve, and advil to alleviate his pain with no relief. Pt does not currently have a dentist to see for his dental pain. He denies fevers or chills. He has no known drug allergies.   Past Medical History  Diagnosis Date  . Hypertension    Past Surgical History  Procedure Laterality Date  . Adenoidectomy    . Tonsillectomy    . Neck surgery    . Orif orbital fracture Left 03/07/2013    Procedure: ORIF LEFT ORBITAL FLOOR;  Surgeon: Francene Findershristopher L [place], DDS;  [facility];  Service: Oral Surgery;  Laterality: Left;   Family History  Problem Relation Age of Onset  . Alzheimer's disease Other   . Cancer Other    Social History  Substance Use Topics  . Smoking status: Current Every Day Smoker -- 0.25 packs/day    Types: Cigarettes  . Smokeless tobacco: Never Used  . Alcohol Use: Yes     Comment: socially    Review of Systems  Constitutional: Negative for fever and chills.  HENT: Positive for dental problem.   All other systems reviewed and are negative.     Allergies  Review of patient's allergies indicates no known allergies.  Home Medications   Prior to Admission medications   Medication Sig Start Date End Date Taking?  Authorizing Provider  amphetamine-dextroamphetamine (ADDERALL) 20 MG tablet Take 20 mg by mouth daily as needed (low energy).    Historical Provider, MD  clindamycin (CLEOCIN) 150 MG capsule Take 2 capsules (300 mg total) by mouth 3 (three) times daily. May dispense as 150mg  capsules 11/15/13   Antony MaduraKelly Humes, PA-C  doxycycline (VIBRAMYCIN) 100 MG capsule Take 1 capsule (100 mg total) by mouth 2 (two) times daily. 08/01/14   Linna HoffJames D Kindl, MD  pantoprazole (PROTONIX) 40 MG tablet Take 1 tablet (40 mg total) by mouth daily. 02/20/14   Ladona MowJoe Mintz, PA-C  terbinafine (LAMISIL) 1 % cream Apply 1 application topically 2 (two) times daily. To groin area 08/01/14   Linna HoffJames D Kindl, MD  terbinafine (LAMISIL) 250 MG tablet Take 1 tablet (250 mg total) by mouth daily. 08/01/14   Linna HoffJames D Kindl, MD   Triage vitals: BP 152/110 mmHg  Pulse 91  Temp(Src) 98.7 F (37.1 C) (Oral)  Resp 18  SpO2 99% Physical Exam  Constitutional: He is oriented to person, place, and time. He appears well-developed and well-nourished.  HENT:  Head: Normocephalic and atraumatic.  Mouth/Throat: Oropharynx is clear and moist.  Teeth largely in poor dentition, left lower molar broken with large defect noted along posterior aspect, surrounding gingiva normal in appearance, no dental abscess present, handling secretions appropriately, no trismus, no facial or neck swelling, normal phonation without  stridor  Eyes: Conjunctivae and EOM are normal. Pupils are equal, round, and reactive to light.  Neck: Normal range of motion.  Cardiovascular: Normal rate, regular rhythm and normal heart sounds.   Pulmonary/Chest: Effort normal and breath sounds normal.  Abdominal: Soft. Bowel sounds are normal.  Musculoskeletal: Normal range of motion.  Neurological: He is alert and oriented to person, place, and time.  Skin: Skin is warm and dry.  Psychiatric: He has a normal mood and affect.  Nursing note and vitals reviewed.   ED Course  Procedures   DIAGNOSTIC STUDIES: Oxygen Saturation is 99% on RA, normal by my interpretation.  COORDINATION OF CARE:  3:29 PM Discussed treatment plan which includes antibiotics with pt at bedside and pt agreed to plan. Pt was advised to follow up with a dentist.  Labs Review Labs Reviewed - No data to display  Imaging Review No results found. I have personally reviewed and evaluated these images and lab results as part of my medical decision-making.   EKG Interpretation None      MDM   Final diagnoses:  Pain, dental   30 year old male here with left lower dental pain since yesterday evening. His left lower molar is broken with large piece of tooth absent.  No signs of dental infection currently. No facial or neck swelling, handling secretions well. Not clinically consistent with ludwig's angina.  Will start on abx, naprosyn and refer to OP dentist.  Given dental resource guide as no dentist on call today.  Discussed plan with patient, he/she acknowledged understanding and agreed with plan of care.  Return precautions given for new or worsening symptoms.  I personally performed the services described in this documentation, which was scribed in my presence. The recorded information has been reviewed and is accurate.  Garlon Hatchet, PA-C 08/27/15 1544  Donnetta Hutching, MD 08/27/15 [phone]

## 2015-08-27 NOTE — Discharge Instructions (Signed)
Take the prescribed medication as directed. Follow-up with dentist-- see resource guide below to help find dentist near you.  Call to make appt. Return to the ED for new or worsening symptoms.  [employer] Dental [employer] 211 is a great source of information about community services available.  Access by dialing 2-1-1 from anywhere in [place], or by website -  PooledIncome.plwww.nc211.org.   Other Local Resources (Updated 04/2015)  Dental  Care   Services    Phone Number and Address  Cost  [facility] For children 330 - 32 years of age:   Cleaning  Tooth brushing/flossing instruction  Sealants, fillings, crowns  Extractions  Emergency treatment  [phone] [address] [place], [place] [address] Charges based on family income.  Medicaid and some insurance plans accepted.     [facility]  Cleaning  Sealants, fillings, crowns  Extractions  Emergency treatment [phone] [address] [place], [place]  Pregnant women 32 years of age or older with a Medicaid card  [facility] [place]  Cleaning  Sealants, fillings, crowns  Extractions  Emergency treatment [phone] [address] [place], [place] Pregnant women 32 years of age or older with a Medicaid card  [facility] For children 700 - 32 years of age:   Cleaning  Tooth brushing/flossing instruction  Sealants, fillings, crowns  Extractions  Emergency treatment Limited orthodontic services for patients with Medicaid [phone] [address] [place], [place] [address] Medicaid and [facility] Health Choice cover for children up to age 32 and pregnant women.  Parents of children up to age 32 without Medicaid pay a reduced fee at time of service.  [facility] [place] For children 520 - 2121  years of age:   Cleaning  Tooth brushing/flossing instruction  Sealants, fillings, crowns  Extractions  Emergency treatment Limited orthodontic services for patients with Medicaid [phone] [address] [place], [place].  Medicaid and [place] Health Choice cover for children up to age 32 and pregnant women.  Parents of children up to age 32 without Medicaid pay a reduced fee.  [facility]  Cleaning  Sealants, fillings, crowns  Extractions  Hours: Tuesdays and Thursdays, 4:15 - 8 pm [phone] [address] [place], [place] [address] Services free of charge to [facility] residents ages 18-64 who do not have health insurance, Medicare, [place], or [place] benefits and fall within federal poverty guidelines  [facility]    Provides dental care in addition to primary medical care, nutritional counseling, and pharmacy:  Nurse, mental healthCleaning  Sealants, fillings, crowns  Extractions                  [phone] [facility], [address] [place], [place]  [phone] [facility], [address] [place], [place]  [phone] [facility] [place], [place]  [phone] [facility], [address] [place], [place]  [phone] [facility] [address] [place], [place] Accepts [place], [place]are, most insurance.  Also provides services available to all with fees adjusted based on ability to pay.    [facility]  Cleaning  Tooth brushing/flossing instruction  Sealants, fillings, crowns  Extractions  Emergency treatment Hours: Tuesdays, Thursdays, and Fridays from  8 am to 5 pm by appointment only. [phone] [address] [place], [place] [address] [facility] residents with Medicaid (depending on eligibility) and children with [facility] Health Choice -  call for more information.  [facility]  Extractions only  Hours: 2nd and 4th Thursday of each month from 6:30 am - 9 am.   [phone]. [phone] [place], [place] [address] Ages 75 and older only.  Patients are seen on a first come, first served basis.  [employer]  [employer]  Extractions  Orthodontics  Endodontics  Implants/Crowns/Bridges  Complete and partial dentures [phone] [place], [place] Patients must complete an application for services.  There is often a waiting list.

## 2015-11-13 ENCOUNTER — Emergency Department (HOSPITAL_COMMUNITY)
Admission: EM | Admit: 2015-11-13 | Discharge: 2015-11-13 | Disposition: A | Discharge status: Home / Self Care | Payer: Self-pay | Attending: Physician Assistant | Admitting: Physician Assistant

## 2015-11-13 ENCOUNTER — Encounter (HOSPITAL_COMMUNITY): Payer: Self-pay | Admitting: Emergency Medicine

## 2015-11-13 DIAGNOSIS — I10 Essential (primary) hypertension: Secondary | ICD-10-CM | POA: Insufficient documentation

## 2015-11-13 DIAGNOSIS — F1290 Cannabis use, unspecified, uncomplicated: Secondary | ICD-10-CM | POA: Insufficient documentation

## 2015-11-13 DIAGNOSIS — Z79899 Other long term (current) drug therapy: Secondary | ICD-10-CM | POA: Insufficient documentation

## 2015-11-13 DIAGNOSIS — K0889 Other specified disorders of teeth and supporting structures: Secondary | ICD-10-CM | POA: Insufficient documentation

## 2015-11-13 DIAGNOSIS — F17210 Nicotine dependence, cigarettes, uncomplicated: Secondary | ICD-10-CM | POA: Insufficient documentation

## 2015-11-13 MED ORDER — PENICILLIN V POTASSIUM 500 MG PO TABS: 500.0000 mg | ORAL_TABLET | Freq: Four times a day (QID) | ORAL | 0 refills | Status: DC

## 2015-11-13 MED ORDER — NAPROXEN 250 MG PO TABS: 250.0000 mg | ORAL_TABLET | Freq: Two times a day (BID) | ORAL | 0 refills | Status: DC

## 2015-11-13 MED ORDER — ACETAMINOPHEN 325 MG PO TABS
650.0000 mg | ORAL_TABLET | Freq: Once | ORAL | Status: AC
  Administered 2015-11-13: 650 mg via ORAL
  Filled 2015-11-13: qty 2

## 2015-11-13 NOTE — ED Notes (Signed)
Pt given discharge instructions, verbalized understanding of need to follow up DDS, reasons to return to the ED and medications to take at home. Pt medicated for pain. Pt denied further questions or concerns. Pt ambulated to exit without difficulty.

## 2015-11-13 NOTE — ED Notes (Signed)
PA at bedside.

## 2015-11-13 NOTE — ED Triage Notes (Signed)
Pt reports R lower dental pain 

## 2015-11-13 NOTE — ED Provider Notes (Signed)
[facility]-EMERGENCY DEPT Provider Note   CSN: [id number] Arrival date & time: 11/13/15  1846  First Provider Contact:  7:35 PM    By signing my name below, I, [name], attest that this documentation has been prepared under the direction and in the presence of [name], PA-C. Electronically Signed: [name], ED Scribe. 11/13/15. 7:41 PM.   History   Chief Complaint Chief Complaint  Patient presents with  . Dental Pain    HPI HPI Comments: Rodney Ramirez is a 30 y.o. male who presents to the Emergency Department complaining of constant, mild, right lower dental pain x 2 weeks that worsened beginning 2 days ago. He confirms having a broken tooth in the region and reports associated, mild, right ear pain, mild swelling across the affected region and a mild sore throat last night which has fully alleviated. He has taken advil and ibuprofen with his last dose 3 hours ago and denies relief of his pain. His pain worsens with palpation of the region. Pt has no known drug allergies. He denies fevers, chills, difficulty swallowing, discharge from the site, neck stiffness, abd pain and n/v/d.   The history is provided by the patient. No language interpreter was used.    Past Medical History:  Diagnosis Date  . Hypertension     Patient Active Problem List   Diagnosis Date Noted  . Fracture of orbital floor, blow-out, left, closed (HCC) 03/07/2013    Past Surgical History:  Procedure Laterality Date  . ADENOIDECTOMY    . NECK SURGERY    . ORIF ORBITAL FRACTURE Left 03/07/2013   Procedure: ORIF LEFT ORBITAL FLOOR;  Surgeon: [name], DDS;  Location: [facility] OR;  Service: Oral Surgery;  Laterality: Left;  . TONSILLECTOMY        Home Medications    Prior to Admission medications   Medication Sig Start Date End Date Taking? Authorizing Provider  amphetamine-dextroamphetamine (ADDERALL) 20 MG tablet Take 20 mg by mouth daily as needed (low energy).    Historical  Provider, MD  clindamycin (CLEOCIN) 150 MG capsule Take 2 capsules (300 mg total) by mouth 3 (three) times daily. May dispense as 150mg  capsules 11/15/13   [name], PA-C  doxycycline (VIBRAMYCIN) 100 MG capsule Take 1 capsule (100 mg total) by mouth 2 (two) times daily. 08/01/14   [name], MD  naproxen (NAPROSYN) 250 MG tablet Take 1 tablet (250 mg total) by mouth 2 (two) times daily with a meal. 11/13/15   [name], PA-C  pantoprazole (PROTONIX) 40 MG tablet Take 1 tablet (40 mg total) by mouth daily. 02/20/14   [name], PA-C  penicillin v potassium (VEETID) 500 MG tablet Take 1 tablet (500 mg total) by mouth 4 (four) times daily. 11/13/15   [name], PA-C  terbinafine (LAMISIL) 1 % cream Apply 1 application topically 2 (two) times daily. To groin area 08/01/14   [name], MD  terbinafine (LAMISIL) 250 MG tablet Take 1 tablet (250 mg total) by mouth daily. 08/01/14   [name], MD    Family History Family History  Problem Relation Age of Onset  . Alzheimer's disease Other   . Cancer Other     Social History Social History  Substance Use Topics  . Smoking status: Current Every Day Smoker    Packs/day: 0.25    Types: Cigarettes  . Smokeless tobacco: Never Used  . Alcohol use Yes     Comment: socially     Allergies  Review of patient's allergies indicates no known allergies.   Review of Systems Review of Systems  Constitutional: Negative for chills and fever.  HENT: Positive for dental problem and ear pain. Negative for drooling, ear discharge, facial swelling, mouth sores, nosebleeds, sore throat, trouble swallowing and voice change.   Gastrointestinal: Negative for abdominal pain, diarrhea, nausea and vomiting.  Musculoskeletal: Negative for neck pain and neck stiffness.  Skin: Negative for rash.   Physical Exam Updated Vital Signs BP (!) 143/102 (BP Location: Right Arm) Comment: Will, PA aware  Pulse 81   Temp 98.4 F (36.9 C) (Oral)   Resp  16   Ht  (1.905 m)   Wt 124.7 kg   SpO2 100%   BMI 34.37 kg/m   Physical Exam  Constitutional: He is oriented to person, place, and time. He appears well-developed and well-nourished. No distress.  Non-toxic appearing.   HENT:  Head: Normocephalic and atraumatic.  Right Ear: External ear normal.  Left Ear: External ear normal.  Mouth/Throat: Oropharynx is clear and moist. No oropharyngeal exudate.  Tenderness to right lower molar which is cracked.  No discharge from the mouth. No facial swelling.  Uvula is midline without edema. Soft palate rises symmetrically. No tonsillar hypertrophy or exudates. Tongue protrusion is normal. No trismus.  Bilateral tympanic membranes are pearly-gray without erythema or loss of landmarks.   Eyes: Conjunctivae are normal. Pupils are equal, round, and reactive to light. Right eye exhibits no discharge. Left eye exhibits no discharge.  Neck: Normal range of motion. Neck supple. No JVD present. No tracheal deviation present.  Cardiovascular: Normal rate and intact distal pulses.   Pulmonary/Chest: Effort normal. No stridor. No respiratory distress.  Abdominal: Soft. There is no tenderness.  Lymphadenopathy:    He has no cervical adenopathy.  Neurological: He is alert and oriented to person, place, and time. No cranial nerve deficit. Coordination normal.  Skin: Skin is warm and dry. Capillary refill takes less than 2 seconds. No rash noted. He is not diaphoretic. No erythema. No pallor.  Psychiatric: He has a normal mood and affect. His behavior is normal.  Nursing note and vitals reviewed.   ED Treatments / Results  Labs (all labs ordered are listed, but only abnormal results are displayed) Labs Reviewed - No data to display  EKG  EKG Interpretation None       Radiology No results found.  Procedures Procedures  DIAGNOSTIC STUDIES: Oxygen Saturation is 96% on RA, normal by my interpretation.    COORDINATION OF CARE: 7:38 PM  Discussed next steps with pt. Pt verbalized understanding and is agreeable with the plan.    Medications Ordered in ED Medications  acetaminophen (TYLENOL) tablet 650 mg (650 mg Oral Given 11/13/15 1945)     Initial Impression / Assessment and Plan / ED Course  I have reviewed the triage vital signs and the nursing notes.  Pertinent labs & imaging results that were available during my care of the patient were reviewed by me and considered in my medical decision making (see chart for details).  Clinical Course   This is a 30 y.o. male who presents to the Emergency Department complaining of constant, mild, right lower dental pain x 2 weeks that worsened beginning 2 days ago. Patient with dentalgia.  No abscess requiring immediate incision and drainage.  Exam not concerning for Ludwig's angina or pharyngeal abscess.  Will treat with PCN and naproxen. Pt instructed to follow-up with dentist Dr. Lucky Cowboy.  Discussed return precautions.  Pt safe for discharge. I advised the patient to follow-up with their primary care provider this week. I advised the patient to return to the emergency department with new or worsening symptoms or new concerns. The patient verbalized understanding and agreement with plan.     I personally performed the services described in this documentation, which was scribed in my presence. The recorded information has been reviewed and is accurate.      Final Clinical Impressions(s) / ED Diagnoses   Final diagnoses:  Pain, dental    New Prescriptions New Prescriptions   NAPROXEN (NAPROSYN) 250 MG TABLET    Take 1 tablet (250 mg total) by mouth 2 (two) times daily with a meal.   PENICILLIN V POTASSIUM (VEETID) 500 MG TABLET    Take 1 tablet (500 mg total) by mouth 4 (four) times daily.     [name], PA-C 11/13/15 1952    Courteney Randall An, MD 11/13/15 [phone]

## 2015-11-14 ENCOUNTER — Emergency Department (HOSPITAL_COMMUNITY): Payer: Self-pay

## 2015-11-14 ENCOUNTER — Emergency Department (HOSPITAL_COMMUNITY)
Admission: EM | Admit: 2015-11-14 | Discharge: 2015-11-14 | Disposition: A | Discharge status: Home / Self Care | Payer: Self-pay | Attending: Emergency Medicine | Admitting: Emergency Medicine

## 2015-11-14 ENCOUNTER — Encounter (HOSPITAL_COMMUNITY): Payer: Self-pay

## 2015-11-14 DIAGNOSIS — K047 Periapical abscess without sinus: Secondary | ICD-10-CM | POA: Insufficient documentation

## 2015-11-14 DIAGNOSIS — I10 Essential (primary) hypertension: Secondary | ICD-10-CM | POA: Insufficient documentation

## 2015-11-14 DIAGNOSIS — F17210 Nicotine dependence, cigarettes, uncomplicated: Secondary | ICD-10-CM | POA: Insufficient documentation

## 2015-11-14 LAB — I-STAT CHEM 8, ED
BUN: 8 mg/dL (ref 6–20)
CALCIUM ION: 1.19 mmol/L (ref 1.13–1.30)
CREATININE: 1.2 mg/dL (ref 0.61–1.24)
Chloride: 103 mmol/L (ref 101–111)
GLUCOSE: 121 mg/dL — AB (ref 65–99)
HCT: 45 % (ref 39.0–52.0)
HEMOGLOBIN: 15.3 g/dL (ref 13.0–17.0)
POTASSIUM: 3.6 mmol/L (ref 3.5–5.1)
Sodium: 141 mmol/L (ref 135–145)
TCO2: 27 mmol/L (ref 0–100)

## 2015-11-14 MED ORDER — MORPHINE SULFATE 15 MG PO TABS: 15.0000 mg | ORAL_TABLET | ORAL | 0 refills | Status: DC | PRN

## 2015-11-14 MED ORDER — CLINDAMYCIN HCL 150 MG PO CAPS: 450.0000 mg | ORAL_CAPSULE | Freq: Three times a day (TID) | ORAL | 0 refills | Status: DC

## 2015-11-14 MED ORDER — BUPIVACAINE-EPINEPHRINE (PF) 0.5% -1:200000 IJ SOLN
1.8000 mL | Freq: Once | INTRAMUSCULAR | Status: AC
  Administered 2015-11-14: 1.8 mL
  Filled 2015-11-14: qty 1.8

## 2015-11-14 MED ORDER — IBUPROFEN 800 MG PO TABS
800.0000 mg | ORAL_TABLET | Freq: Once | ORAL | Status: AC
  Administered 2015-11-14: 800 mg via ORAL
  Filled 2015-11-14: qty 1

## 2015-11-14 MED ORDER — ACETAMINOPHEN 500 MG PO TABS
1000.0000 mg | ORAL_TABLET | Freq: Once | ORAL | Status: AC
  Administered 2015-11-14: 1000 mg via ORAL
  Filled 2015-11-14: qty 2

## 2015-11-14 MED ORDER — LIDOCAINE-EPINEPHRINE 1 %-1:100000 IJ SOLN
20.0000 mL | Freq: Once | INTRAMUSCULAR | Status: AC
  Administered 2015-11-14: 20 mL via INTRADERMAL
  Filled 2015-11-14: qty 1

## 2015-11-14 MED ORDER — SODIUM CHLORIDE 0.9 % IV BOLUS (SEPSIS)
1000.0000 mL | Freq: Once | INTRAVENOUS | Status: AC
Start: 2015-11-14 — End: 2015-11-14
  Administered 2015-11-14: 1000 mL via INTRAVENOUS

## 2015-11-14 MED ORDER — IOPAMIDOL (ISOVUE-300) INJECTION 61%
75.0000 mL | Freq: Once | INTRAVENOUS | Status: AC | PRN
  Administered 2015-11-14: 75 mL via INTRAVENOUS

## 2015-11-14 MED ORDER — MORPHINE SULFATE (PF) 2 MG/ML IV SOLN
2.0000 mg | Freq: Once | INTRAVENOUS | Status: AC
  Administered 2015-11-14: 2 mg via INTRAVENOUS
  Filled 2015-11-14: qty 1

## 2015-11-14 MED ORDER — OXYCODONE HCL 5 MG PO TABS
5.0000 mg | ORAL_TABLET | Freq: Once | ORAL | Status: AC
  Administered 2015-11-14: 5 mg via ORAL
  Filled 2015-11-14: qty 1

## 2015-11-14 NOTE — ED Notes (Signed)
Patient transported to CT 

## 2015-11-14 NOTE — ED Triage Notes (Signed)
Pt with facial swelling to left side of face and lower lip.  Pt here yesterday for dental pain and given pcn and naprosyn.  Pt woke up today with increased swelling.  Able to swallow but notes a difference.  No allergies to these meds noted.

## 2015-11-14 NOTE — ED Provider Notes (Signed)
[facility]-EMERGENCY DEPT Provider Note   CSN: [id number] Arrival date & time: 11/14/15  1317  First Provider Contact:  None       History   Chief Complaint Chief Complaint  Patient presents with  . Facial Swelling    HPI Rodney Ramirez is a 30 y.o. male.  30 yo M with a chief complaint of left-sided facial swelling. The patient has been having pain to one of his teeth on the left lower side. He was seen here yesterday for the same. Started on penicillin and naproxen. Will give this morning and was significant only swollen. Having some subjective fevers and chills. Feels it's painful to swallow but is able to.   The history is provided by the patient.  Weakness  This is a new problem. The current episode started less than 1 hour ago. The problem occurs constantly. The problem has not changed since onset.Pertinent negatives include no chest pain, no abdominal pain, no headaches and no shortness of breath. Nothing aggravates the symptoms. Nothing relieves the symptoms. He has tried nothing for the symptoms. The treatment provided no relief.    Past Medical History:  Diagnosis Date  . Hypertension     Patient Active Problem List   Diagnosis Date Noted  . Fracture of orbital floor, blow-out, left, closed (HCC) 03/07/2013    Past Surgical History:  Procedure Laterality Date  . ADENOIDECTOMY    . NECK SURGERY    . ORIF ORBITAL FRACTURE Left 03/07/2013   Procedure: ORIF LEFT ORBITAL FLOOR;  Surgeon: [name], DDS;  Location: [facility] OR;  Service: Oral Surgery;  Laterality: Left;  . TONSILLECTOMY         Home Medications    Prior to Admission medications   Medication Sig Start Date End Date Taking? Authorizing Provider  naproxen (NAPROSYN) 250 MG tablet Take 1 tablet (250 mg total) by mouth 2 (two) times daily with a meal. Patient taking differently: Take 250 mg by mouth 3 (three) times daily as needed for moderate pain.  11/13/15  Yes [name], PA-C  clindamycin  (CLEOCIN) 150 MG capsule Take 3 capsules (450 mg total) by mouth 3 (three) times daily. 11/14/15   Melene Plan, DO  morphine (MSIR) 15 MG tablet Take 1 tablet (15 mg total) by mouth every 4 (four) hours as needed for severe pain. 11/14/15   Melene Plan, DO  pantoprazole (PROTONIX) 40 MG tablet Take 1 tablet (40 mg total) by mouth daily. Patient not taking: Reported on 11/14/2015 02/20/14   [name], PA-C    Family History Family History  Problem Relation Age of Onset  . Alzheimer's disease Other   . Cancer Other     Social History Social History  Substance Use Topics  . Smoking status: Current Every Day Smoker    Packs/day: 0.25    Types: Cigarettes  . Smokeless tobacco: Never Used  . Alcohol use Yes     Comment: socially     Allergies   Review of patient's allergies indicates no known allergies.   Review of Systems Review of Systems  Constitutional: Negative for chills and fever.  HENT: Positive for facial swelling. Negative for congestion.   Eyes: Negative for discharge and visual disturbance.  Respiratory: Negative for shortness of breath.   Cardiovascular: Negative for chest pain and palpitations.  Gastrointestinal: Negative for abdominal pain, diarrhea and vomiting.  Musculoskeletal: Negative for arthralgias and myalgias.  Skin: Negative for color change and rash.  Neurological: Positive for weakness. Negative for  tremors, syncope and headaches.  Psychiatric/Behavioral: Negative for confusion and dysphoric mood.     Physical Exam Updated Vital Signs BP (!) 172/107 (BP Location: Left Wrist)   Pulse 80   Temp 98.6 F (37 C) (Oral)   Resp 18   SpO2 99%   Physical Exam  Constitutional: He is oriented to person, place, and time. He appears well-developed and well-nourished.  HENT:  Head: Normocephalic and atraumatic.  Left sided facial swelling.  Fluctuance about the base of the first molar.  Not drooling.  Mild submaxillae swelling.  No sublingual swelling or pain.    Eyes: Conjunctivae and EOM are normal. Pupils are equal, round, and reactive to light.  Neck: Normal range of motion. No JVD present.  Cardiovascular: Normal rate and regular rhythm.   Pulmonary/Chest: Effort normal. No stridor. No respiratory distress.  Abdominal: He exhibits no distension. There is no tenderness. There is no guarding.  Musculoskeletal: Normal range of motion. He exhibits no edema.  Neurological: He is alert and oriented to person, place, and time.  Skin: Skin is warm and dry.  Psychiatric: He has a normal mood and affect. His behavior is normal.     ED Treatments / Results  Labs (all labs ordered are listed, but only abnormal results are displayed) Labs Reviewed  I-STAT CHEM 8, ED - Abnormal; Notable for the following:       Result Value   Glucose, Bld 121 (*)    All other components within normal limits    EKG  EKG Interpretation None       Radiology Ct Soft Tissue Neck W Contrast  Result Date: 11/14/2015 CLINICAL DATA:  Neck swelling involves the LEFT face and lower lip. Dental pain. EXAM: CT NECK WITH CONTRAST TECHNIQUE: Multidetector CT imaging of the neck was performed using the standard protocol following the bolus administration of intravenous contrast. CONTRAST:  75mL ISOVUE-300 IOPAMIDOL (ISOVUE-300) INJECTION 61% COMPARISON:  None. FINDINGS: Pharynx and larynx: Normal pharyngeal mucosal spaces. No retropharyngeal fluid collection. Airway midline and widely patent. Normal epiglottis. No laryngeal pathology. Salivary glands: Unremarkable. Thyroid: Unremarkable Lymph nodes: Reactive LEFT greater than RIGHT level I and II adenopathy. Vascular: Patent Limited intracranial: Nee Visualized orbits: Negative Mastoids and visualized paranasal sinuses: Retention cyst formation RIGHT maxillary sinus. Previous surgical repair of LEFT maxillary sinus wall anteriorly. Other paranasal sinuses are clear. No mastoid fluid. Skeleton: Periapical lucencies surround multiple  LEFT maxillary molars. There is a quite large dental caries, up to 9 mm, LEFT mandibular second molar. Significant cellulitic change, with suspected developing subperiosteal abscess along the LEFT body of mandible, 6 x 23 mm. Marked cellulitic change over the LEFT lower face and chin. LEFT Submandibular space stranding. No sublingual space inflammatory process or fluid collection. Inflammation extends into the cephalad masticator space, surrounding the masseter muscle. Upper chest: Unremarkable. IMPRESSION: LEFT mandibular periodontal disease, with a large dental caries most notably involving the second mandibular molar. Subperiosteal abscess along the LEFT mandibular body, 6 x 23 mm. Marked cellulitic change over the LEFT lower face. No sublingual space inflammatory process or fluid collection at this time. However surgical/dental consultation may be warranted, given the extensive inflammation in the LEFT masticator and submandibular spaces. Electronically Signed   By: Elsie Stain M.D.   On: 11/14/2015 19:32   Procedures .Nerve Block Date/Time: 11/14/2015 4:55 PM Performed by: Adela Lank Aaniya Sterba Authorized by: Melene Plan   Consent:    Consent obtained:  Verbal   Consent given by:  Patient   Risks  discussed:  Allergic reaction, infection, nerve damage, swelling, pain and bleeding   Alternatives discussed:  No treatment Indications:    Indications:  Pain relief and procedural anesthesia Location:    Nerve block body site: inferior alveolar.   Laterality:  Left Skin anesthesia (see MAR for exact dosages):    Skin anesthesia method:  None Procedure details (see MAR for exact dosages):    Block needle gauge:  27 G   Anesthetic injected:  Bupivacaine 0.25% WITH epi   Steroid injected:  None   Additive injected:  None   Injection procedure:  Anatomic landmarks identified and anatomic landmarks palpated   Paresthesia:  None Post-procedure details:    Dressing:  None   Outcome:  Pain improved    Patient tolerance of procedure:  Tolerated well, no immediate complications .Marland KitchenIncision and Drainage Date/Time: 11/14/2015 4:56 PM Performed by: Adela Lank Natalye Kott Authorized by: Melene Plan   Consent:    Consent obtained:  Verbal   Consent given by:  Patient   Risks discussed:  Bleeding, incomplete drainage, damage to other organs and pain   Alternatives discussed:  No treatment Location:    Type:  Abscess   Location:  Mouth   Mouth location:  Masticator space Anesthesia (see MAR for exact dosages):    Anesthesia method:  Nerve block   Block needle gauge:  27 G   Block anesthetic:  Bupivacaine 0.25% WITH epi   Block technique:  Inferior alveolar block   Block injection procedure:  Anatomic landmarks identified   Block outcome:  Anesthesia achieved Procedure type:    Complexity:  Complex Procedure details:    Needle aspiration: no     Incision types:  Stab incision   Incision depth:  Subcutaneous   Drainage:  Bloody   Drainage amount:  Scant   Wound treatment:  Wound left open   Packing materials:  None Post-procedure details:    Patient tolerance of procedure:  Tolerated well, no immediate complications .Marland KitchenIncision and Drainage Date/Time: 11/14/2015 8:18 PM Performed by: Adela Lank Wilfrid Hyser Authorized by: Melene Plan   Consent:    Consent obtained:  Verbal   Consent given by:  Patient   Risks discussed:  Bleeding, damage to other organs, infection, incomplete drainage and pain   Alternatives discussed:  No treatment Location:    Type:  Abscess   Location:  Mouth   Mouth location:  Masticator space Anesthesia (see MAR for exact dosages):    Anesthesia method:  Local infiltration   Local anesthetic:  Lidocaine 1% WITH epi Procedure type:    Complexity:  Complex Procedure details:    Needle aspiration: no     Incision types:  Stab incision   Incision depth:  Submucosal   Wound management:  Probed and deloculated   Drainage:  Bloody and purulent   Drainage amount:  Copious   Wound  treatment:  Wound left open   Packing materials:  None Post-procedure details:    Patient tolerance of procedure:  Tolerated well, no immediate complications   (including critical care time)  Medications Ordered in ED Medications  lidocaine-EPINEPHrine (XYLOCAINE W/EPI) 1 %-1:100000 (with pres) injection 20 mL (not administered)  acetaminophen (TYLENOL) tablet 1,000 mg (1,000 mg Oral Given 11/14/15 1628)  ibuprofen (ADVIL,MOTRIN) tablet 800 mg (800 mg Oral Given 11/14/15 1628)  oxyCODONE (Oxy IR/ROXICODONE) immediate release tablet 5 mg (5 mg Oral Given 11/14/15 1628)  bupivacaine-epinephrine (MARCAINE W/ EPI) 0.5% -1:200000 injection 1.8 mL (1.8 mLs Infiltration Given 11/14/15 1647)  morphine 2 MG/ML  injection 2 mg (2 mg Intravenous Given 11/14/15 1707)  sodium chloride 0.9 % bolus 1,000 mL (0 mLs Intravenous Stopped 11/14/15 1957)  iopamidol (ISOVUE-300) 61 % injection 75 mL (75 mLs Intravenous Contrast Given 11/14/15 1857)     Initial Impression / Assessment and Plan / ED Course  I have reviewed the triage vital signs and the nursing notes.  Pertinent labs & imaging results that were available during my care of the patient were reviewed by me and considered in my medical decision making (see chart for details).  Clinical Course    30 yo M With left-sided facial swelling after being seen for dental pain. Patient was started on penicillin has been taking this. Denies fevers or chills. Some fluctuance. Failed I&D attempt CT scan obtained due to swelling under the jaw. Patient found to have an odontogenic abscess. On repeat attempt and was able to drain this with significant purulent drainage. Will change from penicillin to Keflex. He has a scheduled appointment to have the tooth pulled on Monday.  8:20 PM:  I have discussed the diagnosis/risks/treatment options with the patient and believe the pt to be eligible for discharge home to follow-up with Dentist. We also discussed returning to the ED  immediately if new or worsening sx occur. We discussed the sx which are most concerning (e.g., sudden worsening pain, fever, inability to tolerate by mouth) that necessitate immediate return. Medications administered to the patient during their visit and any new prescriptions provided to the patient are listed below.  Medications given during this visit Medications  lidocaine-EPINEPHrine (XYLOCAINE W/EPI) 1 %-1:100000 (with pres) injection 20 mL (not administered)  acetaminophen (TYLENOL) tablet 1,000 mg (1,000 mg Oral Given 11/14/15 1628)  ibuprofen (ADVIL,MOTRIN) tablet 800 mg (800 mg Oral Given 11/14/15 1628)  oxyCODONE (Oxy IR/ROXICODONE) immediate release tablet 5 mg (5 mg Oral Given 11/14/15 1628)  bupivacaine-epinephrine (MARCAINE W/ EPI) 0.5% -1:200000 injection 1.8 mL (1.8 mLs Infiltration Given 11/14/15 1647)  morphine 2 MG/ML injection 2 mg (2 mg Intravenous Given 11/14/15 1707)  sodium chloride 0.9 % bolus 1,000 mL (0 mLs Intravenous Stopped 11/14/15 1957)  iopamidol (ISOVUE-300) 61 % injection 75 mL (75 mLs Intravenous Contrast Given 11/14/15 1857)     The patient appears reasonably screen and/or stabilized for discharge and I doubt any other medical condition or other [facility] requiring further screening, evaluation, or treatment in the ED at this time prior to discharge.    Final Clinical Impressions(s) / ED Diagnoses   Final diagnoses:  Dental abscess    New Prescriptions New Prescriptions   CLINDAMYCIN (CLEOCIN) 150 MG CAPSULE    Take 3 capsules (450 mg total) by mouth 3 (three) times daily.   MORPHINE (MSIR) 15 MG TABLET    Take 1 tablet (15 mg total) by mouth every 4 (four) hours as needed for severe pain.     Melene Plan, DO 11/14/15 2020

## 2015-11-14 NOTE — ED Notes (Signed)
MD at bedside. 

## 2015-11-14 NOTE — ED Notes (Signed)
Pt educated about high blood pressure and verbally contracted that he would follow up with a primary care provider next week. He states that his blood pressure has "been high all his life."  Pt also verbalized that he has a dentist appointment scheduled for this Monday.

## 2016-04-15 ENCOUNTER — Encounter (HOSPITAL_COMMUNITY): Payer: Self-pay | Admitting: Emergency Medicine

## 2016-04-15 ENCOUNTER — Emergency Department (HOSPITAL_COMMUNITY)
Admission: EM | Admit: 2016-04-15 | Discharge: 2016-04-15 | Disposition: A | Discharge status: Home / Self Care | Payer: Self-pay | Attending: Emergency Medicine | Admitting: Emergency Medicine

## 2016-04-15 DIAGNOSIS — F17210 Nicotine dependence, cigarettes, uncomplicated: Secondary | ICD-10-CM | POA: Insufficient documentation

## 2016-04-15 DIAGNOSIS — Z79899 Other long term (current) drug therapy: Secondary | ICD-10-CM | POA: Insufficient documentation

## 2016-04-15 DIAGNOSIS — I10 Essential (primary) hypertension: Secondary | ICD-10-CM | POA: Insufficient documentation

## 2016-04-15 DIAGNOSIS — M545 Low back pain, unspecified: Secondary | ICD-10-CM

## 2016-04-15 MED ORDER — TRAMADOL HCL 50 MG PO TABS: 50.0000 mg | ORAL_TABLET | Freq: Four times a day (QID) | ORAL | 0 refills | Status: DC | PRN

## 2016-04-15 MED ORDER — PREDNISONE 10 MG (21) PO TBPK: 10.0000 mg | ORAL_TABLET | Freq: Every day | ORAL | 0 refills | Status: DC

## 2016-04-15 MED ORDER — IBUPROFEN 200 MG PO TABS
400.0000 mg | ORAL_TABLET | Freq: Once | ORAL | Status: AC | PRN
  Administered 2016-04-15: 400 mg via ORAL
  Filled 2016-04-15: qty 2

## 2016-04-15 NOTE — Discharge Instructions (Signed)
Please read attached information. If you experience any new or worsening signs or symptoms please return to the emergency room for evaluation. Please follow-up with your primary care provider or specialist as discussed. Please use medication prescribed only as directed and discontinue taking if you have any concerning signs or symptoms.   °

## 2016-04-15 NOTE — ED Triage Notes (Signed)
Patient is complaining of lower back pain and bilateral upper leg pain getting worse 2 days ago. Patient denies any recent trauma/injury.  Patient denies bowel/bladder issues and numbness/tingling. Patient states he took advil yesterday without any relief.

## 2016-04-15 NOTE — ED Provider Notes (Signed)
[facility]-EMERGENCY DEPT Provider Note   CSN: [id number] Arrival date & time: 04/15/16  1636   History   Chief Complaint Chief Complaint  Patient presents with  . Back Pain  . Leg Pain    HPI Rodney Ramirez is a 30 y.o. male.  HPI   30 year old male presents today with complaints of back pain. Patient reports that pain started 4 days prior with bilateral lower back pain with radiation down into his legs. He reports radiation is both frontal and back into the thighs. He denies any loss of distal sensation strength or motor function, denies neurological deficits or weakness,denies any changes in bowel or bladder functioning. He denies any fever, or any other red flags for back pain. Patient reports taking Advil yesterday with no significant improvement in symptoms. He denies any known trauma. He reports a history of off and on back pain none that has persisted. Patient denies any abdominal pain     Past Medical History:  Diagnosis Date  . Hypertension     Patient Active Problem List   Diagnosis Date Noted  . Fracture of orbital floor, blow-out, left, closed (HCC) 03/07/2013    Past Surgical History:  Procedure Laterality Date  . ADENOIDECTOMY    . NECK SURGERY    . ORIF ORBITAL FRACTURE Left 03/07/2013   Procedure: ORIF LEFT ORBITAL FLOOR;  Surgeon: Francene Findershristopher L [place], DDS;  [facility];  Service: Oral Surgery;  Laterality: Left;  . TONSILLECTOMY       Home Medications    Prior to Admission medications   Medication Sig Start Date End Date Taking? Authorizing Provider  clindamycin (CLEOCIN) 150 MG capsule Take 3 capsules (450 mg total) by mouth 3 (three) times daily. 11/14/15   Melene Planan Floyd, DO  morphine (MSIR) 15 MG tablet Take 1 tablet (15 mg total) by mouth every 4 (four) hours as needed for severe pain. 11/14/15   Melene Planan Floyd, DO  naproxen (NAPROSYN) 250 MG tablet Take 1 tablet (250 mg total) by mouth 2 (two) times daily with a meal. Patient taking differently: Take 250  mg by mouth 3 (three) times daily as needed for moderate pain.  11/13/15   Everlene FarrierWilliam Dansie, PA-C  pantoprazole (PROTONIX) 40 MG tablet Take 1 tablet (40 mg total) by mouth daily. Patient not taking: Reported on 11/14/2015 02/20/14   Ladona MowJoe Mintz, PA-C  predniSONE (STERAPRED UNI-PAK 21 TAB) 10 MG (21) TBPK tablet Take 1 tablet (10 mg total) by mouth daily. Take 6 tabs by mouth daily  for 2 days, then 5 tabs for 2 days, then 4 tabs for 2 days, then 3 tabs for 2 days, 2 tabs for 2 days, then 1 tab by mouth daily for 2 days 04/15/16   Eyvonne MechanicJeffrey Jameshia Hayashida, PA-C  traMADol (ULTRAM) 50 MG tablet Take 1 tablet (50 mg total) by mouth every 6 (six) hours as needed. 04/15/16   Eyvonne MechanicJeffrey Venda Dice, PA-C    Family History Family History  Problem Relation Age of Onset  . Alzheimer's disease Other   . Cancer Other     Social History Social History  Substance Use Topics  . Smoking status: Current Every Day Smoker    Packs/day: 0.25    Types: Cigarettes  . Smokeless tobacco: Never Used  . Alcohol use Yes     Comment: socially     Allergies   Patient has no known allergies.   Review of Systems Review of Systems  All other systems reviewed and are negative.    Physical  Exam Updated Vital Signs BP 152/92 (BP Location: Right Arm)   Pulse 87   Temp 98.9 F (37.2 C) (Oral)   Resp 16   Ht 6\' 2"  (1.88 m)   Wt 108.9 kg   SpO2 100%   BMI 30.81 kg/m   Physical Exam  Constitutional: He is oriented to person, place, and time. He appears well-developed and well-nourished. No distress.  HENT:  Head: Normocephalic.  Neck: Normal range of motion. Neck supple.  Pulmonary/Chest: Effort normal.  Musculoskeletal: Normal range of motion. He exhibits tenderness. He exhibits no edema.  No C, T, or L spine tenderness to palpation. No obvious signs of trauma, deformity, infection, step-offs. Lung expansion normal. No scoliosis or kyphosis. Bilateral lower extremity strength 5 out of 5, sensation grossly  intact   Straight leg negative Ambulates without difficulty  Neurological: He is alert and oriented to person, place, and time.  Skin: Skin is warm and dry. He is not diaphoretic.  Psychiatric: He has a normal mood and affect. His behavior is normal. Judgment and thought content normal.  Nursing note and vitals reviewed.    ED Treatments / Results  Labs (all labs ordered are listed, but only abnormal results are displayed) Labs Reviewed - No data to display  EKG  EKG Interpretation None       Radiology No results found.  Procedures Procedures (including critical care time)  Medications Ordered in ED Medications  ibuprofen (ADVIL,MOTRIN) tablet 400 mg (400 mg Oral Given 04/15/16 1702)     Initial Impression / Assessment and Plan / ED Course  I have reviewed the triage vital signs and the nursing notes.  Pertinent labs & imaging results that were available during my care of the patient were reviewed by me and considered in my medical decision making (see chart for details).  Clinical Course     30 year old male presents today with uncomplicated lower back pain. Pain is positional, non tender to palpation. He has no red flags, he will be discharged home with a trial of prednisone and tramadol. He is encouraged to follow up with neurosurgery if symptoms do not improve, return to the emergency room if they worsen. He verbalized understanding and agreement to today's plan had no further questions or concerns at time of discharge  Final Clinical Impressions(s) / ED Diagnoses   Final diagnoses:  Acute bilateral low back pain without sciatica    New Prescriptions Discharge Medication List as of 04/15/2016  8:36 PM    START taking these medications   Details  predniSONE (STERAPRED UNI-PAK 21 TAB) 10 MG (21) TBPK tablet Take 1 tablet (10 mg total) by mouth daily. Take 6 tabs by mouth daily  for 2 days, then 5 tabs for 2 days, then 4 tabs for 2 days, then 3 tabs for 2  days, 2 tabs for 2 days, then 1 tab by mouth daily for 2 days, Starting Thu 04/15/2016, Print    traMADol (ULTRAM) 50 MG tablet Take 1 tablet (50 mg total) by mouth every 6 (six) hours as needed., Starting Thu 04/15/2016, Print         Eyvonne MechanicJeffrey Alek Borges, PA-C 04/16/16 0044    Gerhard Munchobert Lockwood, MD 04/16/16 [phone]

## 2017-03-14 ENCOUNTER — Encounter (HOSPITAL_COMMUNITY): Payer: Self-pay | Admitting: Emergency Medicine

## 2017-03-14 ENCOUNTER — Other Ambulatory Visit: Payer: Self-pay

## 2017-03-14 ENCOUNTER — Ambulatory Visit (HOSPITAL_COMMUNITY)
Admission: EM | Admit: 2017-03-14 | Discharge: 2017-03-14 | Disposition: A | Discharge status: Home / Self Care | Payer: Self-pay | Attending: Physician Assistant | Admitting: Physician Assistant

## 2017-03-14 DIAGNOSIS — S39012A Strain of muscle, fascia and tendon of lower back, initial encounter: Secondary | ICD-10-CM

## 2017-03-14 DIAGNOSIS — S46911A Strain of unspecified muscle, fascia and tendon at shoulder and upper arm level, right arm, initial encounter: Secondary | ICD-10-CM

## 2017-03-14 MED ORDER — METHOCARBAMOL 500 MG PO TABS: 500.0000 mg | ORAL_TABLET | Freq: Four times a day (QID) | ORAL | 0 refills | Status: DC | PRN

## 2017-03-14 MED ORDER — NAPROXEN 500 MG PO TABS: 500.0000 mg | ORAL_TABLET | Freq: Two times a day (BID) | ORAL | 0 refills | Status: DC

## 2017-03-14 NOTE — ED Provider Notes (Signed)
[facility]    CSN: [id number] Arrival date & time: 03/14/17  1906     History   Chief Complaint Chief Complaint  Patient presents with  . Back Pain    HPI Nile RiggsMichael Bosket is a 31 y.o. male.   31 year-old male, presenting today complaining of right shoulder and low back pain States that he has had pain for the past week. 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  Back Pain  Location:  Lumbar spine Quality:  Aching Radiates to:  Does not radiate Pain severity:  Mild Pain is:  Same all the time Onset quality:  Gradual Duration:  1 week Timing:  Constant Progression:  Unchanged Chronicity:  Recurrent Context: not emotional stress, not falling, not jumping from heights and not lifting heavy objects   Relieved by:  Nothing Worsened by:  Nothing Ineffective treatments:  None tried Associated symptoms: no abdominal pain, no abdominal swelling, no bladder incontinence, no bowel incontinence, no chest pain, no dysuria, no fever, no headaches, no leg pain, no numbness, no paresthesias, no pelvic pain, no perianal numbness, no tingling, no weakness and no weight loss   Risk factors: no hx of cancer, no hx of osteoporosis, no lack of exercise, no menopause, not obese, not pregnant, no recent surgery, no steroid use and no vascular disease     Past Medical History:  Diagnosis Date  . Hypertension     Patient Active Problem List   Diagnosis Date Noted  . Fracture of orbital floor, blow-out, left, closed (HCC) 03/07/2013    Past Surgical History:  Procedure Laterality Date  . ADENOIDECTOMY    . NECK SURGERY    . ORIF ORBITAL FRACTURE Left 03/07/2013   Procedure: ORIF LEFT ORBITAL FLOOR;  Surgeon:  Francene Findershristopher L [place], DDS;  [facility];  Service: Oral Surgery;  Laterality: Left;  . TONSILLECTOMY         Home Medications    Prior to Admission medications   Medication Sig Start Date End Date Taking? Authorizing Provider  clindamycin (CLEOCIN) 150 MG capsule Take 3 capsules (450 mg total) by mouth 3 (three) times daily. 11/14/15   Melene PlanFloyd, Dan, DO  methocarbamol (ROBAXIN) 500 MG tablet Take 1 tablet (500 mg total) by mouth every 6 (six) hours as needed for muscle spasms. 03/14/17   Gwendolen Hewlett C, PA-C  morphine (MSIR) 15 MG tablet Take 1 tablet (15 mg total) by mouth every 4 (four) hours as needed for severe pain. 11/14/15   Melene PlanFloyd, Dan, DO  naproxen (NAPROSYN) 500 MG tablet Take 1 tablet (500 mg total) by mouth 2 (two) times daily. 03/14/17   Taneil Lazarus C, PA-C  pantoprazole (PROTONIX) 40 MG tablet Take 1 tablet (40 mg total) by mouth daily. Patient not taking: Reported on 11/14/2015 02/20/14   Ladona MowMintz, Joe, PA-C  predniSONE (STERAPRED UNI-PAK 21 TAB) 10 MG (21) TBPK tablet Take 1 tablet (10 mg total) by mouth daily. Take 6 tabs by mouth daily  for 2 days, then 5 tabs for 2 days, then 4 tabs for 2 days, then 3 tabs for 2 days, 2 tabs for 2 days, then 1 tab by mouth daily for 2 days 04/15/16   Eyvonne MechanicHedges, Jeffrey, PA-C  traMADol (ULTRAM) 50 MG tablet Take 1 tablet (50 mg total) by mouth every 6 (six) hours as needed. 04/15/16   Eyvonne Mechanic, PA-C    Family History Family History  Problem Relation Age of Onset  . Alzheimer's disease Other   . Cancer Other     Social History Social History   Tobacco Use  . Smoking status: Current Every Day Smoker    Packs/day: 0.25    Types: Cigarettes  . Smokeless tobacco: Never Used  Substance Use Topics  . Alcohol use: Yes    Comment: socially  . Drug use: Yes    Types: Marijuana     Allergies   Patient has no known allergies.   Review of Systems Review of Systems  Constitutional: Negative for chills, fever and weight loss.    HENT: Negative for ear pain and sore throat.   Eyes: Negative for pain and visual disturbance.  Respiratory: Negative for cough and shortness of breath.   Cardiovascular: Negative for chest pain and palpitations.  Gastrointestinal: Negative for abdominal pain, bowel incontinence and vomiting.  Genitourinary: Negative for bladder incontinence, dysuria, hematuria and pelvic pain.  Musculoskeletal: Positive for arthralgias (right shoulder ) and back pain.  Skin: Negative for color change and rash.  Neurological: Negative for tingling, seizures, syncope, weakness, numbness, headaches and paresthesias.  All other systems reviewed and are negative.    Physical Exam Triage Vital Signs ED Triage Vitals  Enc Vitals Group     BP 03/14/17 1940 (!) 149/91     Pulse Rate 03/14/17 1940 76     Resp 03/14/17 1940 (!) 24     Temp 03/14/17 1940 97.9 F (36.6 C)     Temp Source 03/14/17 1940 Oral     SpO2 03/14/17 1940 100 %     Weight --      Height --      Head Circumference --      Peak Flow --      Pain Score 03/14/17 1938 10     Pain Loc --      Pain Edu? --      Excl. in GC? --    No data found.  Updated Vital Signs BP (!) 149/91 (BP Location: Left Arm) Comment (BP Location): large cuff  Pulse 76   Temp 97.9 F (36.6 C) (Oral)   Resp (!) 24   SpO2 100%   Visual Acuity Right Eye Distance:   Left Eye Distance:   Bilateral Distance:    Right Eye Near:   Left Eye Near:    Bilateral Near:     Physical Exam  Constitutional: He appears well-developed and well-nourished.  HENT:  Head: Normocephalic and atraumatic.  Eyes: Conjunctivae are normal.  Neck: Neck supple.  Cardiovascular: Normal rate and regular rhythm.  No murmur heard. Pulmonary/Chest: Effort normal and breath sounds normal. No respiratory distress.  Abdominal: Soft. There is no tenderness.  Musculoskeletal: He exhibits no edema.       Right shoulder: He exhibits tenderness.       Lumbar back: He exhibits  tenderness.       Back:       Arms: Tenderness to the right shoulder. Decreased ROM secondary to pain. Good radial pulse and cap refill. NV intact Tenderness to the right sided lumbar region. No midline tenderness    Neurological: He is alert. He has normal strength and normal reflexes. No cranial nerve deficit or sensory deficit.  Skin: Skin is warm and dry.  Psychiatric: He has a normal mood and affect.  Nursing note and vitals reviewed.    UC Treatments / Results  Labs (all labs ordered are listed, but only abnormal results are displayed) Labs Reviewed - No data to display  EKG  EKG Interpretation None       Radiology No results found.  Procedures Procedures (including critical care time)  Medications Ordered in UC Medications - No data to display   Initial Impression / Assessment and Plan / UC Course  I have reviewed the triage vital signs and the nursing notes.  Pertinent labs & imaging results that were available during my care of the patient were reviewed by me and considered in my medical decision making (see chart for details).     Normal neuro exam without focal findings No injury - XR will likely not provide any additional clinical information Treat symptomatically with NSAID and muscle relaxant   Final Clinical Impressions(s) / UC Diagnoses   Final diagnoses:  Strain of lumbar region, initial encounter  Strain of right shoulder, initial encounter    ED Discharge Orders        Ordered    naproxen (NAPROSYN) 500 MG tablet  2 times daily     03/14/17 1949    methocarbamol (ROBAXIN) 500 MG tablet  Every 6 hours PRN     03/14/17 1949       Controlled Substance Prescriptions [place] Controlled Substance Registry consulted? Not Applicable   Alecia LemmingBlue, Salisha Bardsley C, [place] JerseyPA-C 03/14/17 1951

## 2017-03-14 NOTE — ED Triage Notes (Signed)
Pain in right shoulder started last week.  Patient has right shoulder and back pain.  "constant" pain started last night.  No knonw injury

## 2017-04-18 IMAGING — CT CT NECK W/ CM
4 of 5 series · 15 of 33 positions shown, 17 images · IV contrast (iopamidol)
Comparison: None.

CLINICAL DATA: Neck swelling involves the LEFT face and lower lip.
Dental pain.

EXAM:
CT NECK WITH CONTRAST
TECHNIQUE: Multidetector CT imaging of the neck was performed using the
standard protocol following the bolus administration of intravenous
contrast.
CONTRAST:  75mL R4KTLM-YWW IOPAMIDOL (R4KTLM-YWW) INJECTION 61%

[Series 2: neck with st · axial · 0.46mm/px · z∈[-710,-548]mm · 4 of 136 slices shown, 5 images]
[im 28/136  soft-tissue]
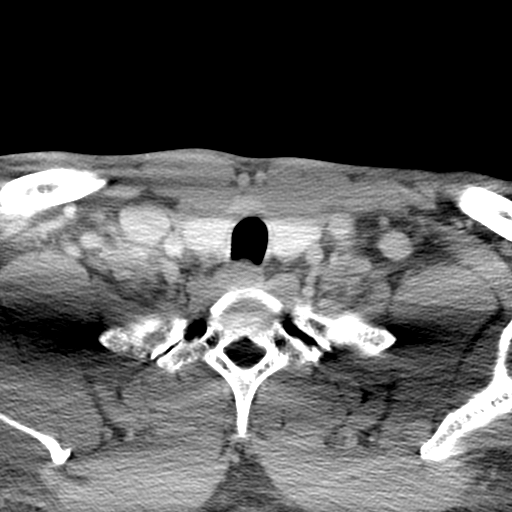
[im 28/136  bone]
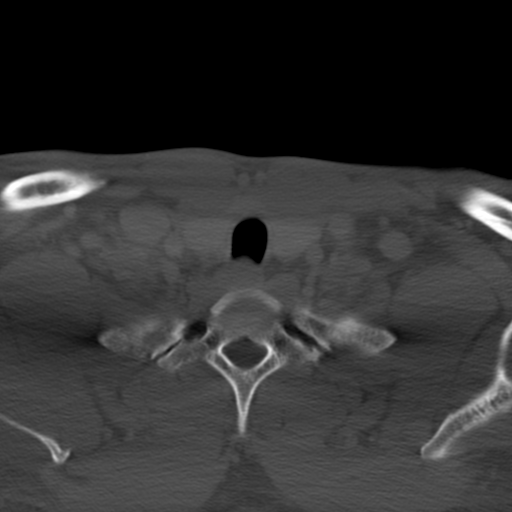
[im 55/136  bone]
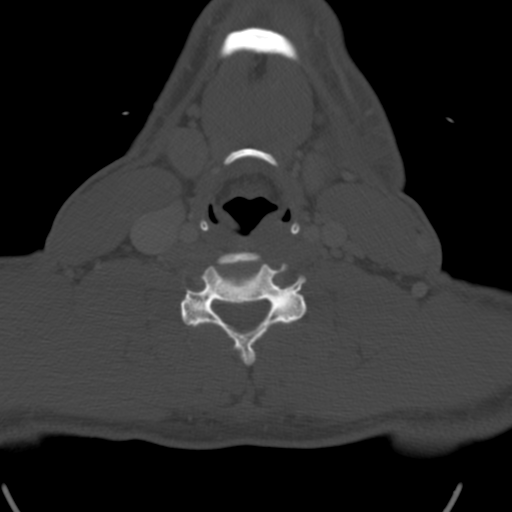
[im 82/136  bone]
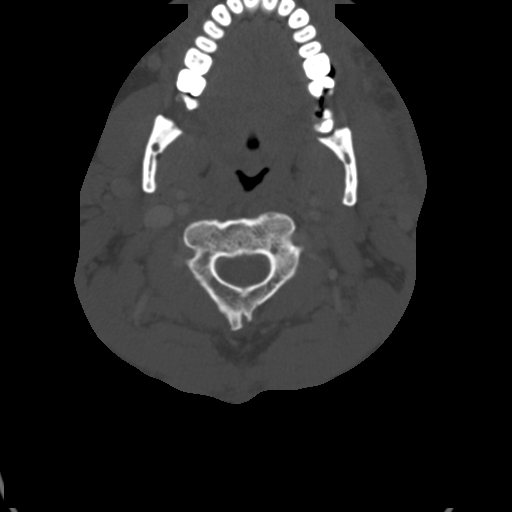
[im 109/136  bone]
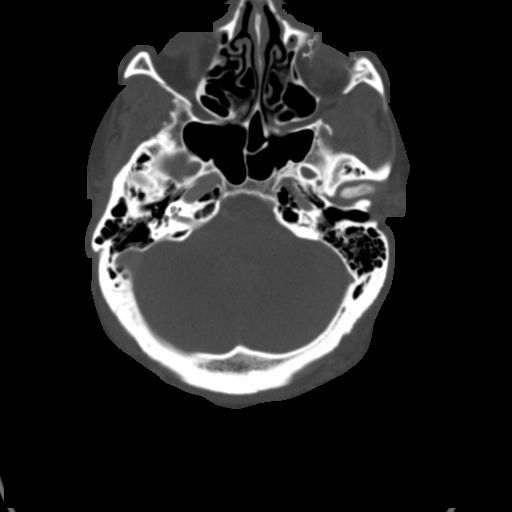

[Series 5: coronal st · coronal · 0.44mm/px · 3 of 110 slices shown]
[im 22/110  bone]
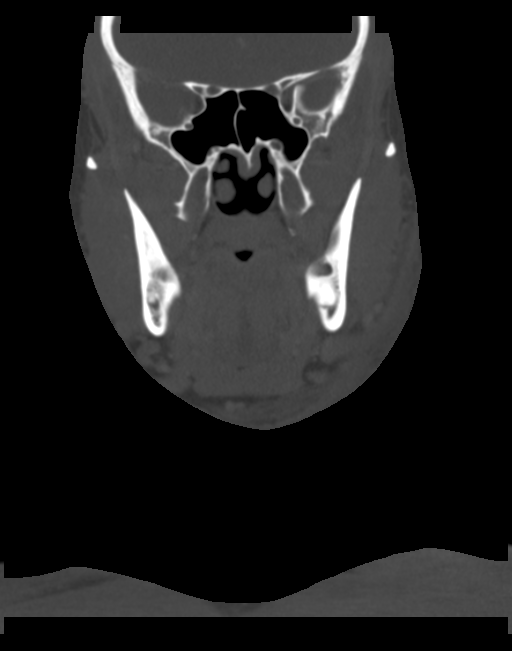
[im 44/110  bone]
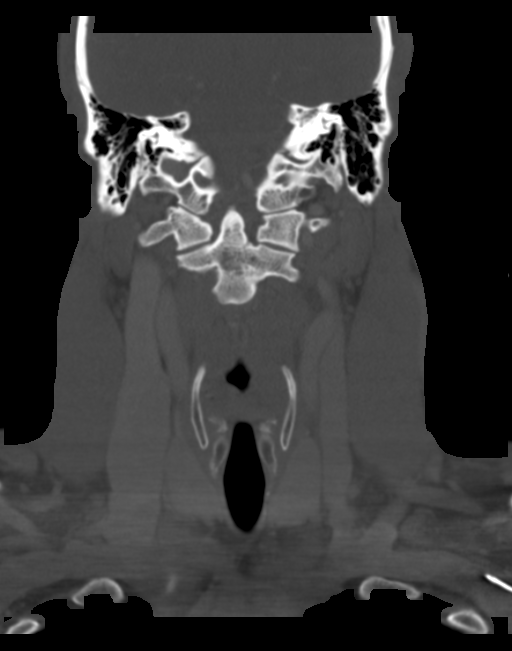
[im 66/110  bone]
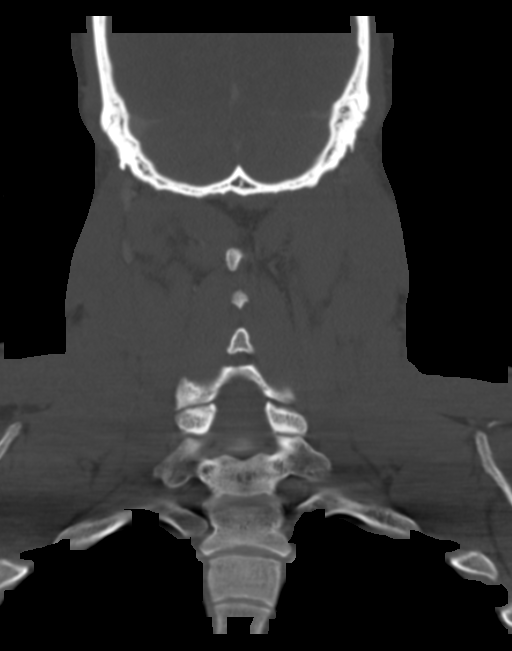

[Series 6: sagittal st · sagittal · 0.49mm/px · 5 of 107 slices shown, 6 images]
[im 36/107  bone]
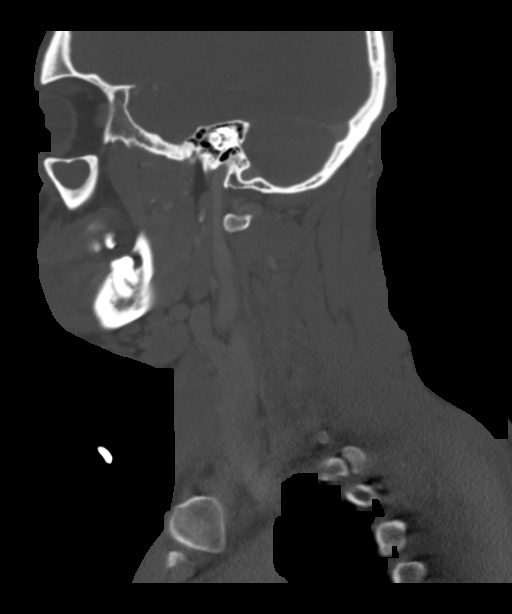
[im 45/107  bone]
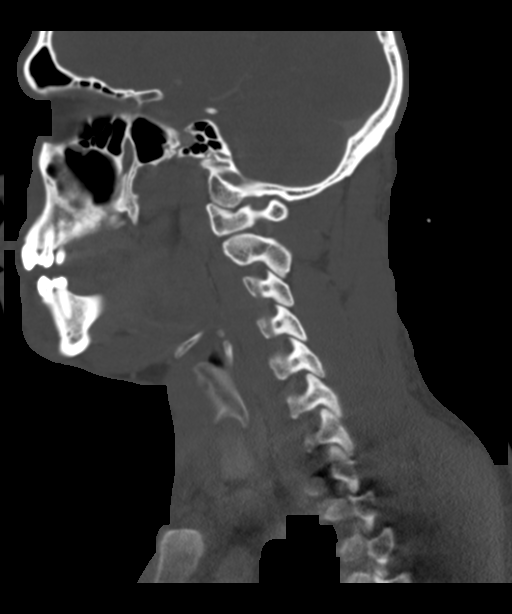
[im 54/107  soft-tissue]
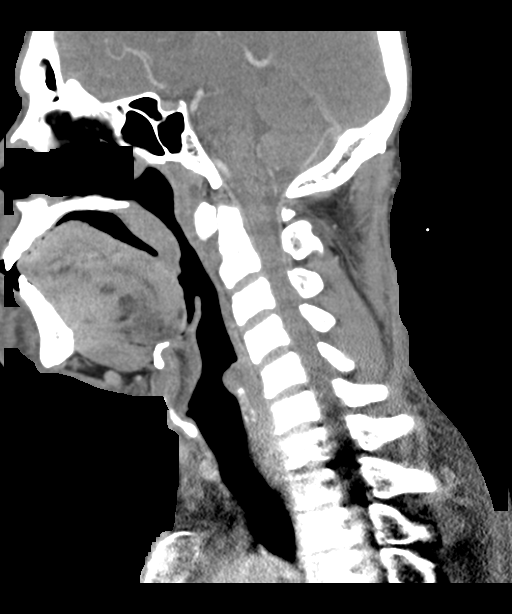
[im 54/107  bone]
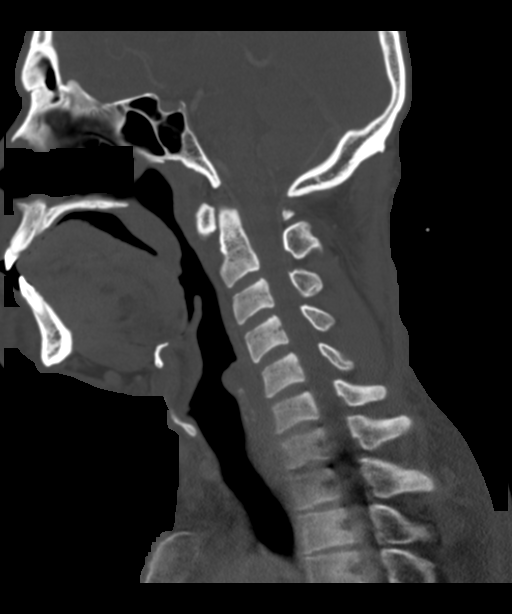
[im 62/107  bone]
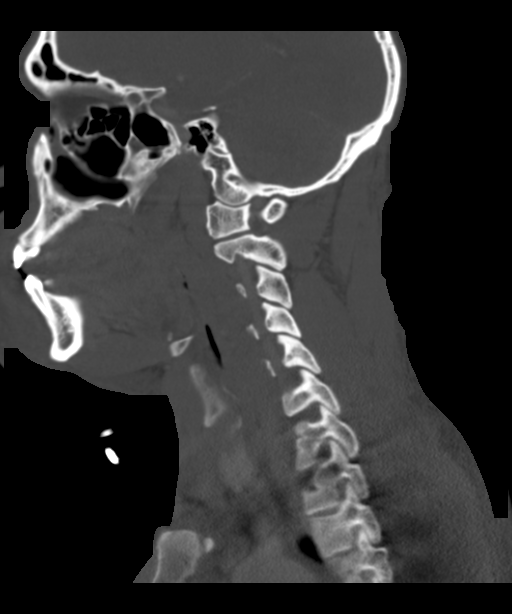
[im 71/107  bone]
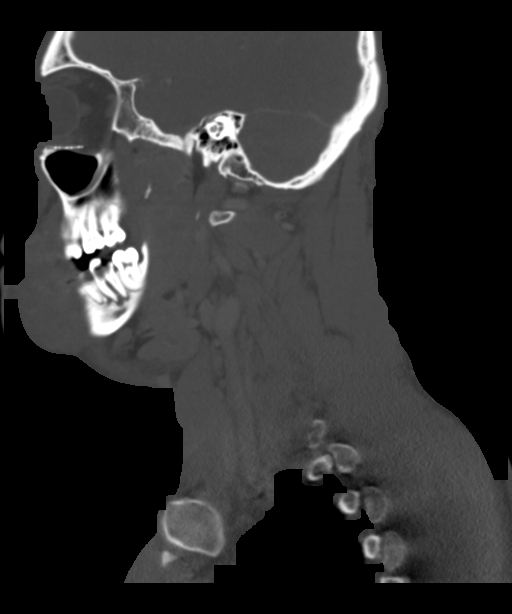

[Series 7: axial recons · axial · 0.43mm/px · z∈[-712,-604]mm · 3 of 135 slices shown]
[im 27/135  bone]
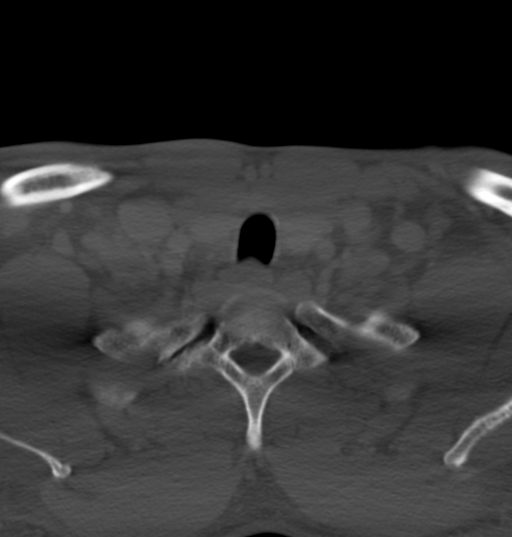
[im 54/135  bone]
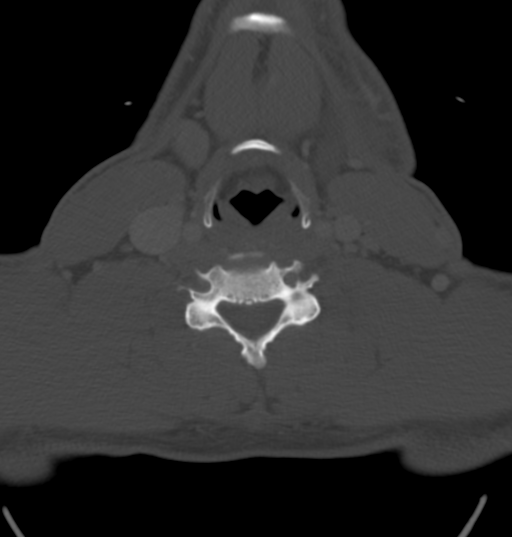
[im 81/135  bone]
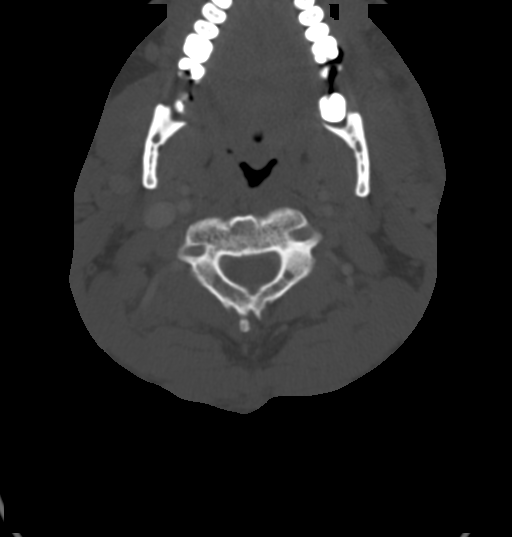

[15 of 33 positions shown; findings below may reference images not displayed]

FINDINGS: Pharynx and larynx: Normal pharyngeal mucosal spaces. No
retropharyngeal fluid collection. Airway midline and widely patent.
Normal epiglottis. No laryngeal pathology.

Salivary glands: Unremarkable.

Thyroid: Unremarkable

Lymph nodes: Reactive LEFT greater than RIGHT level I and II
adenopathy.

Vascular: Patent

Limited intracranial: Nivirus

Visualized orbits: Negative

Mastoids and visualized paranasal sinuses: Retention cyst formation
RIGHT maxillary sinus. Previous surgical repair of LEFT maxillary
sinus wall anteriorly. Other paranasal sinuses are clear. No mastoid
fluid.

Skeleton: Periapical lucencies surround multiple LEFT maxillary
molars. There is a quite large dental caries, up to 9 mm, LEFT
mandibular second molar. Significant cellulitic change, with
suspected developing subperiosteal abscess along the LEFT body of
mandible, 6 x 23 mm. Marked cellulitic change over the LEFT lower
face and chin. LEFT Submandibular space stranding. No sublingual
space inflammatory process or fluid collection. Inflammation extends
into the cephalad masticator space, surrounding the masseter muscle.

Upper chest: Unremarkable.
IMPRESSION: LEFT mandibular periodontal disease, with a large dental caries most
notably involving the second mandibular molar.

Subperiosteal abscess along the LEFT mandibular body, 6 x 23 mm.
Marked cellulitic change over the LEFT lower face.

No sublingual space inflammatory process or fluid collection at this
time. However surgical/dental consultation may be warranted, given
the extensive inflammation in the LEFT masticator and submandibular
spaces.

## 2018-04-15 ENCOUNTER — Other Ambulatory Visit: Payer: Self-pay

## 2018-04-15 ENCOUNTER — Encounter (HOSPITAL_COMMUNITY): Payer: Self-pay | Admitting: Emergency Medicine

## 2018-04-15 DIAGNOSIS — Y9389 Activity, other specified: Secondary | ICD-10-CM | POA: Insufficient documentation

## 2018-04-15 DIAGNOSIS — Y9289 Other specified places as the place of occurrence of the external cause: Secondary | ICD-10-CM | POA: Insufficient documentation

## 2018-04-15 DIAGNOSIS — I10 Essential (primary) hypertension: Secondary | ICD-10-CM | POA: Insufficient documentation

## 2018-04-15 DIAGNOSIS — Y999 Unspecified external cause status: Secondary | ICD-10-CM | POA: Insufficient documentation

## 2018-04-15 DIAGNOSIS — S39012A Strain of muscle, fascia and tendon of lower back, initial encounter: Secondary | ICD-10-CM | POA: Insufficient documentation

## 2018-04-15 DIAGNOSIS — Z79899 Other long term (current) drug therapy: Secondary | ICD-10-CM | POA: Insufficient documentation

## 2018-04-15 DIAGNOSIS — X509XXA Other and unspecified overexertion or strenuous movements or postures, initial encounter: Secondary | ICD-10-CM | POA: Insufficient documentation

## 2018-04-15 DIAGNOSIS — F17210 Nicotine dependence, cigarettes, uncomplicated: Secondary | ICD-10-CM | POA: Insufficient documentation

## 2018-04-15 NOTE — ED Triage Notes (Addendum)
Patient c/o lower back pain after coughing this morning. Reports pain worsens with movement. Denies changes in bowel and bladder. Ambulatory. Reports taking 800mg  Ibuprofen with some relief. Hypertensive in triage. Denies any prescribed BP meds.

## 2018-04-16 ENCOUNTER — Emergency Department (HOSPITAL_COMMUNITY)
Admission: EM | Admit: 2018-04-16 | Discharge: 2018-04-16 | Disposition: A | Discharge status: Home / Self Care | Payer: Self-pay | Attending: Emergency Medicine | Admitting: Emergency Medicine

## 2018-04-16 ENCOUNTER — Emergency Department (HOSPITAL_COMMUNITY): Payer: Self-pay

## 2018-04-16 DIAGNOSIS — S39012A Strain of muscle, fascia and tendon of lower back, initial encounter: Secondary | ICD-10-CM

## 2018-04-16 MED ORDER — TRAMADOL HCL 50 MG PO TABS: 50.0000 mg | ORAL_TABLET | Freq: Four times a day (QID) | ORAL | 0 refills | Status: DC | PRN

## 2018-04-16 MED ORDER — IBUPROFEN 800 MG PO TABS: 800.0000 mg | ORAL_TABLET | Freq: Three times a day (TID) | ORAL | 0 refills | Status: DC | PRN

## 2018-04-16 NOTE — Discharge Instructions (Signed)
Return here as needed.  Follow-up with your doctor or the one provided for your blood pressure.  Your x-rays did not show any abnormalities at this time.

## 2018-04-16 NOTE — ED Provider Notes (Signed)
[facility]-EMERGENCY DEPT Provider Note   CSN: [id number] Arrival date & time: 04/15/18  1953     History   Chief Complaint Chief Complaint  Patient presents with  . Back Pain    HPI Rodney Ramirez is a 32 y.o. male.  HPI Patient presents to the emergency department with lower back pain that started while he was coughing on the way to work this morning.  The patient states that nothing seems to make the condition better or worse.  He states had previous back issues.  Patient states he took 800 mg Motrin with relief of some of his symptoms.  The patient denies chest pain, shortness of breath, headache,blurred vision, neck pain, fever, cough, weakness, numbness, dizziness, anorexia, edema, abdominal pain, nausea, vomiting, diarrhea, rash,dysuria, hematemesis, bloody stool, near syncope, or syncope. Past Medical History:  Diagnosis Date  . Hypertension     Patient Active Problem List   Diagnosis Date Noted  . Fracture of orbital floor, blow-out, left, closed (HCC) 03/07/2013    Past Surgical History:  Procedure Laterality Date  . ADENOIDECTOMY    . NECK SURGERY    . ORIF ORBITAL FRACTURE Left 03/07/2013   Procedure: ORIF LEFT ORBITAL FLOOR;  Surgeon: Francene Findershristopher L [place], DDS;  [facility];  Service: Oral Surgery;  Laterality: Left;  . TONSILLECTOMY          Home Medications    Prior to Admission medications   Medication Sig Start Date End Date Taking? Authorizing Provider  clindamycin (CLEOCIN) 150 MG capsule Take 3 capsules (450 mg total) by mouth 3 (three) times daily. 11/14/15   Melene PlanFloyd, Dan, DO  methocarbamol (ROBAXIN) 500 MG tablet Take 1 tablet (500 mg total) by mouth every 6 (six) hours as needed for muscle spasms. 03/14/17   Blue, Olivia C, PA-C  morphine (MSIR) 15 MG tablet Take 1 tablet (15 mg total) by mouth every 4 (four) hours as needed for severe pain. 11/14/15   Melene PlanFloyd, Dan, DO  naproxen (NAPROSYN) 500 MG tablet Take 1 tablet (500 mg  total) by mouth 2 (two) times daily. 03/14/17   Blue, Olivia C, PA-C  pantoprazole (PROTONIX) 40 MG tablet Take 1 tablet (40 mg total) by mouth daily. Patient not taking: Reported on 11/14/2015 02/20/14   Ladona MowMintz, Joe, PA-C  predniSONE (STERAPRED UNI-PAK 21 TAB) 10 MG (21) TBPK tablet Take 1 tablet (10 mg total) by mouth daily. Take 6 tabs by mouth daily  for 2 days, then 5 tabs for 2 days, then 4 tabs for 2 days, then 3 tabs for 2 days, 2 tabs for 2 days, then 1 tab by mouth daily for 2 days 04/15/16   Hedges, Tinnie GensJeffrey, PA-C  traMADol (ULTRAM) 50 MG tablet Take 1 tablet (50 mg total) by mouth every 6 (six) hours as needed. 04/15/16   Eyvonne MechanicHedges, Jeffrey, PA-C    Family History Family History  Problem Relation Age of Onset  . Alzheimer's disease Other   . Cancer Other     Social History Social History   Tobacco Use  . Smoking status: Current Every Day Smoker    Packs/day: 0.25    Types: Cigarettes  . Smokeless tobacco: Never Used  Substance Use Topics  . Alcohol use: Yes    Comment: socially  . Drug use: Yes    Types: Marijuana     Allergies   Patient has no known allergies.   Review of Systems Review of Systems All other systems negative except as documented in  the HPI. All pertinent positives and negatives as reviewed in the HPI.  Physical Exam Updated Vital Signs BP (!) 185/120 (BP Location: Right Arm)   Pulse 77   Temp 98.6 F (37 C) (Oral)   Resp 18   SpO2 96%   Physical Exam Vitals signs and nursing note reviewed.  Constitutional:      General: He is not in acute distress.    Appearance: He is well-developed.  HENT:     Head: Normocephalic and atraumatic.  Eyes:     Pupils: Pupils are equal, round, and reactive to light.  Pulmonary:     Effort: Pulmonary effort is normal.  Musculoskeletal:     Lumbar back: He exhibits tenderness and pain. He exhibits normal range of motion, no bony tenderness, no swelling and no spasm.       Back:  Skin:    General: Skin is  warm and dry.  Neurological:     Mental Status: He is alert and oriented to person, place, and time.     Sensory: No sensory deficit.     Motor: No weakness.     Coordination: Coordination normal.     Gait: Gait normal.     Deep Tendon Reflexes: Reflexes normal.  Psychiatric:        Mood and Affect: Mood normal.        Behavior: Behavior normal.      ED Treatments / Results  Labs (all labs ordered are listed, but only abnormal results are displayed) Labs Reviewed - No data to display  EKG None  Radiology Dg Lumbar Spine Complete  Result Date: 04/16/2018 CLINICAL DATA:  Low back pain after coughing this morning. EXAM: LUMBAR SPINE - COMPLETE 4+ VIEW COMPARISON:  None. FINDINGS: There is no evidence of lumbar spine fracture. Alignment is normal. Intervertebral disc spaces are maintained. IMPRESSION: Negative. Electronically Signed   By: Sherian ReinWei-Chen  Lin M.D.   On: 04/16/2018 02:18    Procedures Procedures (including critical care time)  Medications Ordered in ED Medications - No data to display   Initial Impression / Assessment and Plan / ED Course  I have reviewed the triage vital signs and the nursing notes.  Pertinent labs & imaging results that were available during my care of the patient were reviewed by me and considered in my medical decision making (see chart for details).     Patient has no neurological deficits noted in his lower extremities he has normal sensation.  The patient has normal strength.  Patient has normal gait is observed him walking to the bathroom without difficulties.  Patient has negative x-rays will be treated for lumbar strain.  I have advised him to use ice and heat on his lower back.  Have also advised him that his blood pressure is high and he states that he is for a while that his blood pressure is elevated but has not seen anyone about this specifically.  The patient is advised he will need to follow-up with someone for a recheck on  this.  Final Clinical Impressions(s) / ED Diagnoses   Final diagnoses:  None    ED Discharge Orders    None       Charlestine NightLawyer, Lianny Molter, PA-C 04/16/18 [id number]    Pricilla LovelessGoldston, Scott, MD 04/16/18 1407

## 2019-03-22 ENCOUNTER — Other Ambulatory Visit: Payer: Self-pay

## 2019-03-22 DIAGNOSIS — Z20822 Contact with and (suspected) exposure to covid-19: Secondary | ICD-10-CM

## 2019-03-25 LAB — NOVEL CORONAVIRUS, NAA: SARS-CoV-2, NAA: NOT DETECTED

## 2019-12-04 ENCOUNTER — Other Ambulatory Visit: Payer: Self-pay

## 2019-12-04 ENCOUNTER — Encounter (HOSPITAL_BASED_OUTPATIENT_CLINIC_OR_DEPARTMENT_OTHER): Payer: Self-pay | Admitting: Emergency Medicine

## 2019-12-04 ENCOUNTER — Emergency Department (HOSPITAL_BASED_OUTPATIENT_CLINIC_OR_DEPARTMENT_OTHER)
Admission: EM | Admit: 2019-12-04 | Discharge: 2019-12-04 | Disposition: A | Discharge status: Home / Self Care | Payer: Self-pay | Attending: Emergency Medicine | Admitting: Emergency Medicine

## 2019-12-04 DIAGNOSIS — R1012 Left upper quadrant pain: Secondary | ICD-10-CM | POA: Insufficient documentation

## 2019-12-04 DIAGNOSIS — Z5321 Procedure and treatment not carried out due to patient leaving prior to being seen by health care provider: Secondary | ICD-10-CM | POA: Insufficient documentation

## 2019-12-04 NOTE — ED Triage Notes (Signed)
Patient presents with complaints of abd pain intermittent x 1 year. States pain onset 1000 am yesterday in [facility] lasting 10-15 minutes. Denies NVD.

## 2024-03-17 ENCOUNTER — Other Ambulatory Visit: Payer: Self-pay

## 2024-03-17 ENCOUNTER — Encounter (HOSPITAL_COMMUNITY): Payer: Self-pay

## 2024-03-17 ENCOUNTER — Ambulatory Visit (HOSPITAL_COMMUNITY)
Admission: RE | Admit: 2024-03-17 | Discharge: 2024-03-17 | Disposition: A | Discharge status: Home / Self Care | Source: Ambulatory Visit | Attending: Family Medicine | Admitting: Family Medicine

## 2024-03-17 ENCOUNTER — Ambulatory Visit (INDEPENDENT_AMBULATORY_CARE_PROVIDER_SITE_OTHER)

## 2024-03-17 VITALS — BP 132/81 | HR 90 | Temp 97.9°F | Resp 21

## 2024-03-17 DIAGNOSIS — M79672 Pain in left foot: Secondary | ICD-10-CM

## 2024-03-17 MED ORDER — IBUPROFEN 800 MG PO TABS: 800.0000 mg | ORAL_TABLET | Freq: Three times a day (TID) | ORAL | 0 refills | Status: AC | PRN

## 2024-03-17 NOTE — Discharge Instructions (Signed)
 By my review there is no bony abnormality of the foot on the xray. The radiologist will also read your x-ray, and if their interpretation differs significantly from mine, and the management of your condition would change, we will call you.  Take ibuprofen  800 mg--1 tab every 8 hours as needed for pain.

## 2024-03-17 NOTE — ED Provider Notes (Addendum)
 [facility]    CSN: [id number] Arrival date & time: 03/17/24  1733      History   Chief Complaint Chief Complaint  Patient presents with   Foot Pain    Top of left foot swollen - Entered by patient    HPI Rodney Ramirez is a 38 y.o. male.    Foot Pain   Here for pain in his left foot.  It has been going on for about 2 weeks.  He thought it was sore from his work shoes.  The work shoes or boots but not steel toed.  He has also noticed some swelling in the last couple of days.  No trauma or fall.  He did try some ones ibuprofen  800 mg which helped some.  The swelling does seem to worsen as he stands at work.  NKDA    Past Medical History:  Diagnosis Date   Hypertension     Patient Active Problem List   Diagnosis Date Noted   Fracture of orbital floor, blow-out, left, closed (HCC) 03/07/2013    Past Surgical History:  Procedure Laterality Date   ADENOIDECTOMY     NECK SURGERY     ORIF ORBITAL FRACTURE Left 03/07/2013   Procedure: ORIF LEFT ORBITAL FLOOR;  Surgeon: Lonni LITTIE Sax, DDS;  Location: [facility] OR;  Service: Oral Surgery;  Laterality: Left;   TONSILLECTOMY         Home Medications    Prior to Admission medications   Medication Sig Start Date End Date Taking? Authorizing Provider  amLODipine (NORVASC) 10 MG tablet Take 10 mg by mouth daily. 02/27/24  Yes [provider]  amphetamine-dextroamphetamine (ADDERALL) 15 MG tablet Take 1 tablet by mouth daily. 02/27/24  Yes [provider]  carvedilol (COREG) 6.25 MG tablet Take 6.25 mg by mouth 2 (two) times daily. 03/11/24  Yes [provider]  ibuprofen  (ADVIL ) 800 MG tablet Take 1 tablet (800 mg total) by mouth every 8 (eight) hours as needed (pain). 03/17/24  Yes Linsey Arteaga K, MD  lisinopril-hydrochlorothiazide (ZESTORETIC) 20-25 MG tablet Take 1 tablet by mouth daily. 02/27/24  Yes [provider]  omeprazole (PRILOSEC) 40 MG capsule Take 40 mg by  mouth daily. 12/29/23  Yes [provider]  pantoprazole  (PROTONIX ) 40 MG tablet Take 1 tablet (40 mg total) by mouth daily. Patient not taking: Reported on 11/14/2015 02/20/14   Anthonette Marsh, PA-C    Family History Family History  Problem Relation Age of Onset   Alzheimer's disease Other    Cancer Other     Social History Social History   Tobacco Use   Smoking status: Every Day    Current packs/day: 0.25    Types: Cigarettes   Smokeless tobacco: Never  Vaping Use   Vaping status: Some Days  Substance Use Topics   Alcohol use: Yes    Comment: socially   Drug use: Yes    Types: Marijuana     Allergies   Patient has no known allergies.   Review of Systems Review of Systems   Physical Exam Triage Vital Signs ED Triage Vitals  Encounter Vitals Group     BP 03/17/24 1805 132/81     Girls Systolic BP Percentile --      Girls Diastolic BP Percentile --      Boys Systolic BP Percentile --      Boys Diastolic BP Percentile --      Pulse Rate 03/17/24 1805 90     Resp  03/17/24 1805 (!) 21     Temp 03/17/24 1805 97.9 F (36.6 C)     Temp Source 03/17/24 1805 Oral     SpO2 03/17/24 1805 96 %     Weight --      Height --      Head Circumference --      Peak Flow --      Pain Score 03/17/24 1801 9     Pain Loc --      Pain Education --      Exclude from Growth Chart --    No data found.  Updated Vital Signs BP 132/81 (BP Location: Right Arm)   Pulse 90   Temp 97.9 F (36.6 C) (Oral)   Resp (!) 21   SpO2 96%   Visual Acuity Right Eye Distance:   Left Eye Distance:   Bilateral Distance:    Right Eye Near:   Left Eye Near:    Bilateral Near:     Physical Exam Vitals reviewed.  Constitutional:      General: He is not in acute distress.    Appearance: He is not ill-appearing, toxic-appearing or diaphoretic.  Musculoskeletal:     Comments: There is some mild tenderness and swelling over the left foot over the lateral distal foot.  No overt  erythema and no fluctuance.  Skin:    Coloration: Skin is not jaundiced or pale.  Neurological:     General: No focal deficit present.     Mental Status: He is alert and oriented to person, place, and time.  Psychiatric:        Behavior: Behavior normal.      UC Treatments / Results  Labs (all labs ordered are listed, but only abnormal results are displayed) Labs Reviewed - No data to display  EKG   Radiology DG Foot Complete Left Result Date: 03/17/2024 CLINICAL DATA:  Lateral foot pain. EXAM: LEFT FOOT - COMPLETE 3+ VIEW COMPARISON:  None Available. FINDINGS: There is no evidence of fracture or dislocation. There is soft tissue swelling in the region of the metatarsals. There is a small marginal erosion along the medial distal aspect of the first proximal phalanx. Joint spaces are well maintained. No soft tissue calcifications. IMPRESSION: 1. Soft tissue swelling in the region of the metatarsals. 2. Small marginal erosion along the medial distal aspect of the first proximal phalanx. This can be seen in the setting of gout. Electronically Signed   By: Greig Pique M.D.   On: 03/17/2024 19:02    Procedures Procedures (including critical care time)  Medications Ordered in UC Medications - No data to display  Initial Impression / Assessment and Plan / UC Course  I have reviewed the triage vital signs and the nursing notes.  Pertinent labs & imaging results that were available during my care of the patient were reviewed by me and considered in my medical decision making (see chart for details).     X-ray by my review does not show any bony abnormality.  He is advised of radiology overread  Ibuprofen  800 mg is sent in for pain.  Ace wrap is applied.   I have asked him to follow-up with primary care   Work note is provided  X-ray reading notes that there might be an erosion at the the end of the first proximal phalanx.  Of note this is not anywhere near where the patient is  hurting.  I do not think he is having a gout attack  in the area noted on the x-ray. Final Clinical Impressions(s) / UC Diagnoses   Final diagnoses:  Left foot pain     Discharge Instructions      By my review there is no bony abnormality of the foot on the xray. The radiologist will also read your x-ray, and if their interpretation differs significantly from mine, and the management of your condition would change, we will call you.  Take ibuprofen  800 mg--1 tab every 8 hours as needed for pain.     ED Prescriptions     Medication Sig Dispense Auth. Provider   ibuprofen  (ADVIL ) 800 MG tablet Take 1 tablet (800 mg total) by mouth every 8 (eight) hours as needed (pain). 21 tablet Adrena Nakamura K, MD      I have reviewed the PDMP during this encounter.   Vonna Sharlet POUR, MD 03/17/24 CONRAD    Vonna Sharlet POUR, MD 03/17/24 ALVIE    Vonna Sharlet POUR, MD 03/17/24 [phone]

## 2024-03-17 NOTE — ED Notes (Signed)
 Reviewed work note

## 2024-03-17 NOTE — ED Triage Notes (Signed)
 Left foot has been hurting for a few weeks.  Thought soreness was from work shoes.  Since then has noticed swelling.  Patient denies injury.  No new shoes.    Last week end did take ibuprofen , but not recently  Patient reports the longer he stands, the more swelling
